# Patient Record
Sex: Female | Born: 1945
Health system: Southern US, Community
[De-identification: ages and names within clinical notes are randomized; demographics above are authoritative.]

## PROBLEM LIST (undated history)

## (undated) DIAGNOSIS — R7303 Prediabetes: Secondary | ICD-10-CM

## (undated) DIAGNOSIS — D649 Anemia, unspecified: Secondary | ICD-10-CM

## (undated) DIAGNOSIS — Z5189 Encounter for other specified aftercare: Secondary | ICD-10-CM

## (undated) DIAGNOSIS — E785 Hyperlipidemia, unspecified: Secondary | ICD-10-CM

## (undated) DIAGNOSIS — H269 Unspecified cataract: Secondary | ICD-10-CM

## (undated) DIAGNOSIS — I10 Essential (primary) hypertension: Secondary | ICD-10-CM

## (undated) HISTORY — DX: Prediabetes: R73.03

## (undated) HISTORY — DX: Hyperlipidemia, unspecified: E78.5

## (undated) HISTORY — PX: ABDOMINAL HYSTERECTOMY: SHX81

## (undated) HISTORY — DX: Encounter for other specified aftercare: Z51.89

## (undated) HISTORY — DX: Unspecified cataract: H26.9

## (undated) HISTORY — DX: Essential (primary) hypertension: I10

---

## 2017-09-30 ENCOUNTER — Ambulatory Visit (INDEPENDENT_AMBULATORY_CARE_PROVIDER_SITE_OTHER): Payer: Medicare HMO | Admitting: Internal Medicine

## 2017-09-30 ENCOUNTER — Encounter: Payer: Self-pay | Admitting: Internal Medicine

## 2017-09-30 ENCOUNTER — Ambulatory Visit: Payer: Self-pay | Admitting: Internal Medicine

## 2017-09-30 VITALS — BP 138/80 | HR 77 | Temp 98.3°F | Resp 18 | Ht 67.0 in | Wt 198.0 lb

## 2017-09-30 DIAGNOSIS — M8580 Other specified disorders of bone density and structure, unspecified site: Secondary | ICD-10-CM

## 2017-09-30 DIAGNOSIS — Z13818 Encounter for screening for other digestive system disorders: Secondary | ICD-10-CM | POA: Diagnosis not present

## 2017-09-30 DIAGNOSIS — Z113 Encounter for screening for infections with a predominantly sexual mode of transmission: Secondary | ICD-10-CM

## 2017-09-30 DIAGNOSIS — I10 Essential (primary) hypertension: Secondary | ICD-10-CM

## 2017-09-30 DIAGNOSIS — Z1159 Encounter for screening for other viral diseases: Secondary | ICD-10-CM

## 2017-09-30 DIAGNOSIS — Z9289 Personal history of other medical treatment: Secondary | ICD-10-CM

## 2017-09-30 DIAGNOSIS — R7303 Prediabetes: Secondary | ICD-10-CM

## 2017-09-30 DIAGNOSIS — E559 Vitamin D deficiency, unspecified: Secondary | ICD-10-CM

## 2017-09-30 DIAGNOSIS — Z1231 Encounter for screening mammogram for malignant neoplasm of breast: Secondary | ICD-10-CM

## 2017-09-30 LAB — COMPREHENSIVE METABOLIC PANEL
ALT: 14 U/L (ref 0–35)
AST: 16 U/L (ref 0–37)
Albumin: 4.4 g/dL (ref 3.5–5.2)
Alkaline Phosphatase: 59 U/L (ref 39–117)
BUN: 16 mg/dL (ref 6–23)
CHLORIDE: 104 meq/L (ref 96–112)
CO2: 32 meq/L (ref 19–32)
Calcium: 9.5 mg/dL (ref 8.4–10.5)
Creatinine, Ser: 0.93 mg/dL (ref 0.40–1.20)
GFR: 76.5 mL/min (ref >60.00)
Glucose, Bld: 102 mg/dL — ABNORMAL HIGH (ref 70–99)
Potassium: 3.8 mEq/L (ref 3.5–5.1)
Sodium: 141 mEq/L (ref 135–145)
Total Bilirubin: 1.5 mg/dL — ABNORMAL HIGH (ref 0.2–1.2)
Total Protein: 7 g/dL (ref 6.0–8.3)

## 2017-09-30 LAB — CBC WITH DIFFERENTIAL/PLATELET
BASOS PCT: 0.7 % (ref 0.0–3.0)
Basophils Absolute: 0 10*3/uL (ref 0.0–0.1)
Eosinophils Absolute: 0.1 10*3/uL (ref 0.0–0.7)
Eosinophils Relative: 2.4 % (ref 0.0–5.0)
HEMATOCRIT: 40.2 % (ref 36.0–46.0)
Hemoglobin: 13.1 g/dL (ref 12.0–15.0)
Lymphocytes Relative: 32.7 % (ref 12.0–46.0)
Lymphs Abs: 1.4 10*3/uL (ref 0.7–4.0)
MCHC: 32.7 g/dL (ref 30.0–36.0)
MCV: 88.4 fl (ref 78.0–100.0)
MONOS PCT: 8.5 % (ref 3.0–12.0)
Monocytes Absolute: 0.4 10*3/uL (ref 0.1–1.0)
NEUTROS ABS: 2.3 10*3/uL (ref 1.4–7.7)
Neutrophils Relative %: 55.7 % (ref 43.0–77.0)
PLATELETS: 232 10*3/uL (ref 150.0–400.0)
RBC: 4.55 Mil/uL (ref 3.87–5.11)
RDW: 14.7 % (ref 11.5–15.5)
WBC: 4.1 10*3/uL (ref 4.0–10.5)

## 2017-09-30 LAB — LIPID PANEL
CHOLESTEROL: 209 mg/dL — AB (ref 0–200)
HDL: 57.7 mg/dL (ref >39.00)
LDL Cholesterol: 134 mg/dL — ABNORMAL HIGH (ref 0–99)
NonHDL: 151.69
Total CHOL/HDL Ratio: 4
Triglycerides: 88 mg/dL (ref 0.0–149.0)
VLDL: 17.6 mg/dL (ref 0.0–40.0)

## 2017-09-30 LAB — TSH: TSH: 1.44 u[IU]/mL (ref 0.35–4.50)

## 2017-09-30 LAB — URINALYSIS, ROUTINE W REFLEX MICROSCOPIC
Bilirubin Urine: NEGATIVE
HGB URINE DIPSTICK: NEGATIVE
KETONES UR: NEGATIVE
LEUKOCYTES UA: NEGATIVE
Nitrite: NEGATIVE
RBC / HPF: NONE SEEN (ref 0–?)
SPECIFIC GRAVITY, URINE: 1.02 (ref 1.000–1.030)
Total Protein, Urine: NEGATIVE
Urine Glucose: NEGATIVE
Urobilinogen, UA: 0.2 (ref 0.0–1.0)
pH: 5.5 (ref 5.0–8.0)

## 2017-09-30 LAB — VITAMIN D 25 HYDROXY (VIT D DEFICIENCY, FRACTURES): VITD: 54.7 ng/mL (ref 30.00–100.00)

## 2017-09-30 NOTE — Patient Instructions (Addendum)
Please follow up in 3 months for physical  Please do cologaurd  [facility]  Take care   Pneumococcal Conjugate Vaccine (PCV13) What You Need to Know 1. Why get vaccinated? Vaccination can protect both children and adults from pneumococcal disease. Pneumococcal disease is caused by bacteria that can spread from person to person through close contact. It can cause ear infections, and it can also lead to more serious infections of the:  Lungs (pneumonia),  Blood (bacteremia), and  Covering of the brain and spinal cord (meningitis).  Pneumococcal pneumonia is most common among adults. Pneumococcal meningitis can cause deafness and brain damage, and it kills about 1 child in 10 who get it. Anyone can get pneumococcal disease, but children under 412 years of age and adults 2865 years and older, people with certain medical conditions, and cigarette smokers are at the highest risk. Before there was a vaccine, the [place] saw:  more than 700 cases of meningitis,  about 13,000 blood infections,  about 5 million ear infections, and  about 200 deaths  in children under 5 each year from pneumococcal disease. Since vaccine became available, severe pneumococcal disease in these children has fallen by 88%. About 18,000 older adults die of pneumococcal disease each year in the [place]. Treatment of pneumococcal infections with penicillin and other drugs is not as effective as it used to be, because some strains of the disease have become resistant to these drugs. This makes prevention of the disease, through vaccination, even more important. 2. PCV13 vaccine Pneumococcal conjugate vaccine (called PCV13) protects against 13 types of pneumococcal bacteria. PCV13 is routinely given to children at 2, 4, 6, and 7912-4415 months of age. It is also recommended for children and adults 672 to 164 years of age with certain health conditions, and for all adults 71 years of age and  older. Your doctor can give you details. 3. Some people should not get this vaccine Anyone who has ever had a life-threatening allergic reaction to a dose of this vaccine, to an earlier pneumococcal vaccine called PCV7, or to any vaccine containing diphtheria toxoid (for example, DTaP), should not get PCV13. Anyone with a severe allergy to any component of PCV13 should not get the vaccine. Tell your doctor if the person being vaccinated has any severe allergies. If the person scheduled for vaccination is not feeling well, your healthcare provider might decide to reschedule the shot on another day. 4. Risks of a vaccine reaction With any medicine, including vaccines, there is a chance of reactions. These are usually mild and go away on their own, but serious reactions are also possible. Problems reported following PCV13 varied by age and dose in the series. The most common problems reported among children were:  About half became drowsy after the shot, had a temporary loss of appetite, or had redness or tenderness where the shot was given.  About 1 out of 3 had swelling where the shot was given.  About 1 out of 3 had a mild fever, and about 1 in 20 had a fever over 102.82F.  Up to about 8 out of 10 became fussy or irritable.  Adults have reported pain, redness, and swelling where the shot was given; also mild fever, fatigue, headache, chills, or muscle pain. Young children who get PCV13 along with inactivated flu vaccine at the same time may be at increased risk for seizures caused by fever. Ask your doctor for more information. Problems that could  happen after any vaccine:  People sometimes faint after a medical procedure, including vaccination. Sitting or lying down for about 15 minutes can help prevent fainting, and injuries caused by a fall. Tell your doctor if you feel dizzy, or have vision changes or ringing in the ears.  Some older children and adults get severe pain in the shoulder and  have difficulty moving the arm where a shot was given. This happens very rarely.  Any medication can cause a severe allergic reaction. Such reactions from a vaccine are very rare, estimated at about 1 in a million doses, and would happen within a few minutes to a few hours after the vaccination. As with any medicine, there is a very small chance of a vaccine causing a serious injury or death. The safety of vaccines is always being monitored. For more information, visit: http://floyd.org/ 5. What if there is a serious reaction? What should I look for? Look for anything that concerns you, such as signs of a severe allergic reaction, very high fever, or unusual behavior. Signs of a severe allergic reaction can include hives, swelling of the face and throat, difficulty breathing, a fast heartbeat, dizziness, and weakness-usually within a few minutes to a few hours after the vaccination. What should I do?  If you think it is a severe allergic reaction or other emergency that can't wait, call 9-1-1 or get the person to the nearest hospital. Otherwise, call your doctor.  Reactions should be reported to the Vaccine Adverse Event Reporting System (VAERS). Your doctor should file this report, or you can do it yourself through the VAERS web site at www.vaers.LAgents.no, or by calling 1-(641)764-8489. ? VAERS does not give medical advice. 6. The National Vaccine Injury Compensation Program The [employer] Vaccine Injury Compensation Program (VICP) is a federal program that was created to compensate people who may have been injured by certain vaccines. Persons who believe they may have been injured by a vaccine can learn about the program and about filing a claim by calling 1-(231)104-7957 or visiting the VICP website at SpiritualWord.at. There is a time limit to file a claim for compensation. 7. How can I learn more?  Ask your healthcare provider. He or she can give you the vaccine package  insert or suggest other sources of information.  Call your local or state health department.  Contact the Centers for Disease Control and Prevention (CDC): ? Call 408-674-6071 (1-800-CDC-INFO) or ? Visit CDC's website at PicCapture.uy Vaccine Information Statement, PCV13 Vaccine (07/18/2014) This information is not intended to replace advice given to you by your health care provider. Make sure you discuss any questions you have with your health care provider. Document Released: 06/27/2006 Document Revised: 05/20/2016 Document Reviewed: 05/20/2016 Elsevier Interactive Patient Education  2017 [employer]  DTaP Vaccine (Diphtheria, Tetanus, and Pertussis): What You Need to Know 1. Why get vaccinated? Diphtheria, tetanus, and pertussis are serious diseases caused by bacteria. Diphtheria and pertussis are spread from person to person. Tetanus enters the body through cuts or wounds. DIPHTHERIA causes a thick covering in the back of the throat.  It can lead to breathing problems, paralysis, heart failure, and even death.  TETANUS (Lockjaw) causes painful tightening of the muscles, usually all over the body.  It can lead to "locking" of the jaw so the victim cannot open his mouth or swallow. Tetanus leads to death in up to 2 out of 10 cases.  PERTUSSIS (Whooping Cough) causes coughing spells so bad that it is hard for infants  to eat, drink, or breathe. These spells can last for weeks.  It can lead to pneumonia, seizures (jerking and staring spells), brain damage, and death.  Diphtheria, tetanus, and pertussis vaccine (DTaP) can help prevent these diseases. Most children who are vaccinated with DTaP will be protected throughout childhood. Many more children would get these diseases if we stopped vaccinating. DTaP is a safer version of an older vaccine called DTP. DTP is no longer used in the [place]. 2. Who should get DTaP vaccine and when? Children should get 5 doses of DTaP  vaccine, one dose at each of the following ages:  2 months  4 months  6 months  15-18 months  4-6 years  DTaP may be given at the same time as other vaccines. 3. Some children should not get DTaP vaccine or should wait  Children with minor illnesses, such as a cold, may be vaccinated. But children who are moderately or severely ill should usually wait until they recover before getting DTaP vaccine.  Any child who had a life-threatening allergic reaction after a dose of DTaP should not get another dose.  Any child who suffered a brain or nervous system disease within 7 days after a dose of DTaP should not get another dose.  Talk with your doctor if your child: ? had a seizure or collapsed after a dose of DTaP, ? cried non-stop for 3 hours or more after a dose of DTaP, ? had a fever over 105F after a dose of DTaP. Ask your doctor for more information. Some of these children should not get another dose of pertussis vaccine, but may get a vaccine without pertussis, called DT. 4. Older children and adults DTaP is not licensed for adolescents, adults, or children 83 years of age and older. But older people still need protection. A vaccine called Tdap is similar to DTaP. A single dose of Tdap is recommended for people 11 through 71 years of age. Another vaccine, called Td, protects against tetanus and diphtheria, but not pertussis. It is recommended every 10 years. There are separate Vaccine Information Statements for these vaccines. 5. What are the risks from DTaP vaccine? Getting diphtheria, tetanus, or pertussis disease is much riskier than getting DTaP vaccine. However, a vaccine, like any medicine, is capable of causing serious problems, such as severe allergic reactions. The risk of DTaP vaccine causing serious harm, or death, is extremely small. Mild problems (common)  Fever (up to about 1 child in 4)  Redness or swelling where the shot was given (up to about 1 child in  4)  Soreness or tenderness where the shot was given (up to about 1 child in 4) These problems occur more often after the 4th and 5th doses of the DTaP series than after earlier doses. Sometimes the 4th or 5th dose of DTaP vaccine is followed by swelling of the entire arm or leg in which the shot was given, lasting 1-7 days (up to about 1 child in 30). Other mild problems include:  Fussiness (up to about 1 child in 3)  Tiredness or poor appetite (up to about 1 child in 10)  Vomiting (up to about 1 child in 50) These problems generally occur 1-3 days after the shot. Moderate problems (uncommon)  Seizure (jerking or staring) (about 1 child out of 14,000)  Non-stop crying, for 3 hours or more (up to about 1 child out of 1,000)  High fever, over 105F (about 1 child out of 16,000) Severe problems (very  rare)  Serious allergic reaction (less than 1 out of a million doses)  Several other severe problems have been reported after DTaP vaccine. These include: ? Long-term seizures, coma, or lowered consciousness ? Permanent brain damage. These are so rare it is hard to tell if they are caused by the vaccine. Controlling fever is especially important for children who have had seizures, for any reason. It is also important if another family member has had seizures. You can reduce fever and pain by giving your child an aspirin-free pain reliever when the shot is given, and for the next 24 hours, following the package instructions. 6. What if there is a serious reaction? What should I look for? Look for anything that concerns you, such as signs of a severe allergic reaction, very high fever, or behavior changes. Signs of a severe allergic reaction can include hives, swelling of the face and throat, difficulty breathing, a fast heartbeat, dizziness, and weakness. These would start a few minutes to a few hours after the vaccination. What should I do?  If you think it is a severe allergic reaction or  other emergency that can't wait, call 9-1-1 or get the person to the nearest hospital. Otherwise, call your doctor.  Afterward, the reaction should be reported to the Vaccine Adverse Event Reporting System (VAERS). Your doctor might file this report, or you can do it yourself through the VAERS web site at www.vaers.LAgents.no, or by calling 1-(303)572-1240. ? VAERS is only for reporting reactions. They do not give medical advice. 7. The National Vaccine Injury Compensation Program The [employer] Vaccine Injury Compensation Program (VICP) is a federal program that was created to compensate people who may have been injured by certain vaccines. Persons who believe they may have been injured by a vaccine can learn about the program and about filing a claim by calling 1-608-088-8738 or visiting the VICP website at SpiritualWord.at. 8. How can I learn more?  Ask your doctor.  Call your local or state health department.  Contact the Centers for Disease Control and Prevention (CDC): ? Call 4254395719 (1-800-CDC-INFO) or ? Visit CDC's website at PicCapture.uy CDC DTaP Vaccine (Diphtheria, Tetanus, and Pertussis) VIS (01/27/06) This information is not intended to replace advice given to you by your health care provider. Make sure you discuss any questions you have with your health care provider. Document Released: 06/27/2006 Document Revised: 05/20/2016 Document Reviewed: 05/20/2016 Elsevier Interactive Patient Education  2017 [employer] Inc.   Hypertension Hypertension, commonly called high blood pressure, is when the force of blood pumping through the arteries is too strong. The arteries are the blood vessels that carry blood from the heart throughout the body. Hypertension forces the heart to work harder to pump blood and may cause arteries to become narrow or stiff. Having untreated or uncontrolled hypertension can cause heart attacks, strokes, kidney disease, and other  problems. A blood pressure reading consists of a higher number over a lower number. Ideally, your blood pressure should be below 120/80. The first ("top") number is called the systolic pressure. It is a measure of the pressure in your arteries as your heart beats. The second ("bottom") number is called the diastolic pressure. It is a measure of the pressure in your arteries as the heart relaxes. What are the causes? The cause of this condition is not known. What increases the risk? Some risk factors for high blood pressure are under your control. Others are not. Factors you can change  Smoking.  Having type 2 diabetes mellitus,  high cholesterol, or both.  Not getting enough exercise or physical activity.  Being overweight.  Having too much fat, sugar, calories, or salt (sodium) in your diet.  Drinking too much alcohol. Factors that are difficult or impossible to change  Having chronic kidney disease.  Having a family history of high blood pressure.  Age. Risk increases with age.  Race. You may be at higher risk if you are African-American.  Gender. Men are at higher risk than women before age 43. After age 55, women are at higher risk than men.  Having obstructive sleep apnea.  Stress. What are the signs or symptoms? Extremely high blood pressure (hypertensive crisis) may cause:  Headache.  Anxiety.  Shortness of breath.  Nosebleed.  Nausea and vomiting.  Severe chest pain.  Jerky movements you cannot control (seizures).  How is this diagnosed? This condition is diagnosed by measuring your blood pressure while you are seated, with your arm resting on a surface. The cuff of the blood pressure monitor will be placed directly against the skin of your upper arm at the level of your heart. It should be measured at least twice using the same arm. Certain conditions can cause a difference in blood pressure between your right and left arms. Certain factors can cause blood  pressure readings to be lower or higher than normal (elevated) for a short period of time:  When your blood pressure is higher when you are in a health care provider's office than when you are at home, this is called white coat hypertension. Most people with this condition do not need medicines.  When your blood pressure is higher at home than when you are in a health care provider's office, this is called masked hypertension. Most people with this condition may need medicines to control blood pressure.  If you have a high blood pressure reading during one visit or you have normal blood pressure with other risk factors:  You may be asked to return on a different day to have your blood pressure checked again.  You may be asked to monitor your blood pressure at home for 1 week or longer.  If you are diagnosed with hypertension, you may have other blood or imaging tests to help your health care provider understand your overall risk for other conditions. How is this treated? This condition is treated by making healthy lifestyle changes, such as eating healthy foods, exercising more, and reducing your alcohol intake. Your health care provider may prescribe medicine if lifestyle changes are not enough to get your blood pressure under control, and if:  Your systolic blood pressure is above 130.  Your diastolic blood pressure is above 80.  Your personal target blood pressure may vary depending on your medical conditions, your age, and other factors. Follow these instructions at home: Eating and drinking  Eat a diet that is high in fiber and potassium, and low in sodium, added sugar, and fat. An example eating plan is called the DASH (Dietary Approaches to Stop Hypertension) diet. To eat this way: ? Eat plenty of fresh fruits and vegetables. Try to fill half of your plate at each meal with fruits and vegetables. ? Eat whole grains, such as whole wheat pasta, brown rice, or whole grain bread. Fill  about one quarter of your plate with whole grains. ? Eat or drink low-fat dairy products, such as skim milk or low-fat yogurt. ? Avoid fatty cuts of meat, processed or cured meats, and poultry with skin. Fill about  one quarter of your plate with lean proteins, such as fish, chicken without skin, beans, eggs, and tofu. ? Avoid premade and processed foods. These tend to be higher in sodium, added sugar, and fat.  Reduce your daily sodium intake. Most people with hypertension should eat less than 1,500 mg of sodium a day.  Limit alcohol intake to no more than 1 drink a day for nonpregnant women and 2 drinks a day for men. One drink equals 12 oz of beer, 5 oz of wine, or 1 oz of hard liquor. Lifestyle  Work with your health care provider to maintain a healthy body weight or to lose weight. Ask what an ideal weight is for you.  Get at least 30 minutes of exercise that causes your heart to beat faster (aerobic exercise) most days of the week. Activities may include walking, swimming, or biking.  Include exercise to strengthen your muscles (resistance exercise), such as pilates or lifting weights, as part of your weekly exercise routine. Try to do these types of exercises for 30 minutes at least 3 days a week.  Do not use any products that contain nicotine or tobacco, such as cigarettes and e-cigarettes. If you need help quitting, ask your health care provider.  Monitor your blood pressure at home as told by your health care provider.  Keep all follow-up visits as told by your health care provider. This is important. Medicines  Take over-the-counter and prescription medicines only as told by your health care provider. Follow directions carefully. Blood pressure medicines must be taken as prescribed.  Do not skip doses of blood pressure medicine. Doing this puts you at risk for problems and can make the medicine less effective.  Ask your health care provider about side effects or reactions to  medicines that you should watch for. Contact a health care provider if:  You think you are having a reaction to a medicine you are taking.  You have headaches that keep coming back (recurring).  You feel dizzy.  You have swelling in your ankles.  You have trouble with your vision. Get help right away if:  You develop a severe headache or confusion.  You have unusual weakness or numbness.  You feel faint.  You have severe pain in your chest or abdomen.  You vomit repeatedly.  You have trouble breathing. Summary  Hypertension is when the force of blood pumping through your arteries is too strong. If this condition is not controlled, it may put you at risk for serious complications.  Your personal target blood pressure may vary depending on your medical conditions, your age, and other factors. For most people, a normal blood pressure is less than 120/80.  Hypertension is treated with lifestyle changes, medicines, or a combination of both. Lifestyle changes include weight loss, eating a healthy, low-sodium diet, exercising more, and limiting alcohol. This information is not intended to replace advice given to you by your health care provider. Make sure you discuss any questions you have with your health care provider. Document Released: 08/30/2005 Document Revised: 07/28/2016 Document Reviewed: 07/28/2016 Elsevier Interactive Patient Education  [employer].

## 2017-10-01 LAB — HEPATITIS C ANTIBODY
Hepatitis C Ab: NONREACTIVE
SIGNAL TO CUT-OFF: 0.01 (ref <1.00)

## 2017-10-01 LAB — HEPATITIS B SURFACE ANTIGEN: Hepatitis B Surface Ag: NONREACTIVE

## 2017-10-01 LAB — HEPATITIS B SURFACE ANTIBODY, QUANTITATIVE: Hepatitis B-Post: <5 m[IU]/mL — ABNORMAL LOW (ref >=10)

## 2017-10-02 ENCOUNTER — Encounter: Payer: Self-pay | Admitting: Internal Medicine

## 2017-10-02 DIAGNOSIS — M8580 Other specified disorders of bone density and structure, unspecified site: Secondary | ICD-10-CM | POA: Insufficient documentation

## 2017-10-02 DIAGNOSIS — M8589 Other specified disorders of bone density and structure, multiple sites: Secondary | ICD-10-CM | POA: Insufficient documentation

## 2017-10-02 DIAGNOSIS — R7303 Prediabetes: Secondary | ICD-10-CM | POA: Insufficient documentation

## 2017-10-02 NOTE — Progress Notes (Signed)
Chief Complaint  Patient presents with  . Establish Care   Establish care moved from [place] back to home of [place] recently  1. HTN BP controlled 138/90 Telmisartan-hctz 80-25 mg qd on BP meds x 5 years.   2. She requests referral for mammogram due and never had colonoscopy agreeable to cologuard    Review of Systems  Constitutional: Negative for weight loss.  HENT: Negative for hearing loss.   Eyes:       +cataracts +wears glasses   Respiratory: Negative for shortness of breath.   Cardiovascular: Negative for chest pain.  Gastrointestinal: Negative for abdominal pain.  Musculoskeletal: Negative for falls.  Skin: Negative for rash.  Neurological: Negative for headaches.  Psychiatric/Behavioral: Negative for memory loss.   Past Medical History:  Diagnosis Date  . Blood transfusion without reported diagnosis   . Cataract    ? which eye wears glasses   . Hypertension   . Prediabetes    Past Surgical History:  Procedure Laterality Date  . ABDOMINAL HYSTERECTOMY     over 30 years fibroids no h/o abnormal pap    Family History  Problem Relation Age of Onset  . Arthritis Mother   . Diabetes Mother   . Hypertension Mother   . Alcohol abuse Father   . Arthritis Father    Social History   Socioeconomic History  . Marital status: Single    Spouse name: Not on file  . Number of children: 0  . Years of education: Not on file  . Highest education level: Not on file  Social Needs  . Financial resource strain: Not on file  . Food insecurity - worry: Not on file  . Food insecurity - inability: Not on file  . Transportation needs - medical: Not on file  . Transportation needs - non-medical: Not on file  Occupational History  . Not on file  Tobacco Use  . Smoking status: Former Games developer  . Smokeless tobacco: Never Used  . Tobacco comment: in 30s smoked x 2 years max 4-5 cig/day  Substance and Sexual Activity  . Alcohol use: No    Frequency: Never  . Drug use: No  .  Sexual activity: No  Other Topics Concern  . 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 Cholecalciferol (VITAMIN D3) 1000 units CAPS Take by mouth.  . cyanocobalamin 1000 MCG tablet Take 1,000 mcg by mouth daily.  . Multiple Vitamins-Minerals (MULTIVITAMIN WOMEN) TABS Take by mouth daily.  Marland Kitchen telmisartan-hydrochlorothiazide (MICARDIS HCT) 80-25 MG tablet Take 1 tablet by mouth daily.  . [DISCONTINUED] telmisartan-hydrochlorothiazide (MICARDIS HCT) 80-12.5 MG tablet Take 1 tablet by mouth daily.   Allergies  Allergen Reactions  . Penicillins     Vaginal infections   Recent Results (from the past 2160 hour(s))  Comprehensive metabolic panel     Status: Abnormal   Collection Time: 09/30/17 10:51 AM  Result Value Ref Range   Sodium 141 135 - 145 mEq/L   Potassium 3.8 3.5 - 5.1 mEq/L   Chloride 104 96 - 112 mEq/L   CO2 32 19 - 32 mEq/L   Glucose, Bld 102 (H) 70 - 99 mg/dL   BUN 16 6 - 23 mg/dL   Creatinine, Ser 1.61 0.40 - 1.20  mg/dL   Total Bilirubin 1.5 (H) 0.2 - 1.2 mg/dL   Alkaline Phosphatase 59 39 - 117 U/L   AST 16 0 - 37 U/L   ALT 14 0 - 35 U/L   Total Protein 7.0 6.0 - 8.3 g/dL   Albumin 4.4 3.5 - 5.2 g/dL   Calcium 9.5 8.4 - 16.1 mg/dL   GFR 09.60 >45.40 mL/min  CBC with Differential/Platelet     Status: None   Collection Time: 09/30/17 10:51 AM  Result Value Ref Range   WBC 4.1 4.0 - 10.5 K/uL   RBC 4.55 3.87 - 5.11 Mil/uL   Hemoglobin 13.1 12.0 - 15.0 g/dL   HCT 98.1 19.1 - 47.8 %   MCV 88.4 78.0 - 100.0 fl   MCHC 32.7 30.0 - 36.0 g/dL   RDW 29.5 62.1 - 30.8 %   Platelets 232.0 150.0 - 400.0 K/uL   Neutrophils Relative % 55.7 43.0 - 77.0 %   Lymphocytes Relative 32.7 12.0 - 46.0 %   Monocytes Relative 8.5 3.0 - 12.0 %   Eosinophils Relative 2.4 0.0 - 5.0 %   Basophils  Relative 0.7 0.0 - 3.0 %   Neutro Abs 2.3 1.4 - 7.7 K/uL   Lymphs Abs 1.4 0.7 - 4.0 K/uL   Monocytes Absolute 0.4 0.1 - 1.0 K/uL   Eosinophils Absolute 0.1 0.0 - 0.7 K/uL   Basophils Absolute 0.0 0.0 - 0.1 K/uL  Urinalysis, Routine w reflex microscopic     Status: Abnormal   Collection Time: 09/30/17 10:51 AM  Result Value Ref Range   Color, Urine YELLOW Yellow;Lt. Yellow   APPearance CLEAR Clear   Specific Gravity, Urine 1.020 1.000 - 1.030   pH 5.5 5.0 - 8.0   Total Protein, Urine NEGATIVE Negative   Urine Glucose NEGATIVE Negative   Ketones, ur NEGATIVE Negative   Bilirubin Urine NEGATIVE Negative   Hgb urine dipstick NEGATIVE Negative   Urobilinogen, UA 0.2 0.0 - 1.0   Leukocytes, UA NEGATIVE Negative   Nitrite NEGATIVE Negative   WBC, UA 0-2/hpf 0-2/hpf   RBC / HPF none seen 0-2/hpf   Squamous Epithelial / LPF Rare(0-4/hpf) Rare(0-4/hpf)   Bacteria, UA Few(10-50/hpf) (A) None  TSH     Status: None   Collection Time: 09/30/17 10:51 AM  Result Value Ref Range   TSH 1.44 0.35 - 4.50 uIU/mL  Lipid panel     Status: Abnormal   Collection Time: 09/30/17 10:51 AM  Result Value Ref Range   Cholesterol 209 (H) 0 - 200 mg/dL    Comment: ATP III Classification       Desirable:  < 200 mg/dL               Borderline High:  200 - 239 mg/dL          High:  > = 657 mg/dL   Triglycerides 84.6 0.0 - 149.0 mg/dL    Comment: Normal:  <962 mg/dLBorderline High:  150 - 199 mg/dL   HDL 95.28 >41.32 mg/dL   VLDL 44.0 0.0 - 10.2 mg/dL   LDL Cholesterol 725 (H) 0 - 99 mg/dL   Total CHOL/HDL Ratio 4     Comment:                Men          Women1/2 Average Risk     3.4          3.3Average Risk          5.0  4.42X Average Risk          9.6          7.13X Average Risk          15.0          11.0                       NonHDL 151.69     Comment: NOTE:  Non-HDL goal should be 30 mg/dL higher than patient's LDL goal (i.e. LDL goal of < 70 mg/dL, would have non-HDL goal of < 100 mg/dL)  Vitamin  D (25 hydroxy)     Status: None   Collection Time: 09/30/17 10:51 AM  Result Value Ref Range   VITD 54.70 30.00 - 100.00 ng/mL  Hepatitis B surface antibody     Status: Abnormal   Collection Time: 09/30/17 10:51 AM  Result Value Ref Range   Hepatitis B-Post <5 (L) > OR = 10 mIU/mL    Comment: . Patient does not have immunity to hepatitis B virus. . For additional information, please refer to http://education.questdiagnostics.com/faq/FAQ105 (This link is being provided for informational/ educational purposes only).   Hepatitis B surface antigen     Status: None   Collection Time: 09/30/17 10:51 AM  Result Value Ref Range   Hepatitis B Surface Ag NON-REACTIVE NON-REACTI  Hepatitis C antibody     Status: None   Collection Time: 09/30/17 10:51 AM  Result Value Ref Range   Hepatitis C Ab NON-REACTIVE NON-REACTI   SIGNAL TO CUT-OFF 0.01 <1.00   Objective  Body mass index is 31.01 kg/m. Wt Readings from Last 3 Encounters:  09/30/17 198 lb (89.8 kg)   Temp Readings from Last 3 Encounters:  09/30/17 98.3 F (36.8 C) (Oral)   BP Readings from Last 3 Encounters:  09/30/17 138/80   Pulse Readings from Last 3 Encounters:  09/30/17 77   O2 sat room air 96%   Physical Exam  Constitutional: She is oriented to person, place, and time and well-developed, well-nourished, and in no distress. Vital signs are normal.  HENT:  Head: Normocephalic and atraumatic.  Eyes: Conjunctivae are normal. Pupils are equal, round, and reactive to light.  Cardiovascular: Normal rate, regular rhythm and normal heart sounds.  Pulmonary/Chest: Effort normal and breath sounds normal. She has no wheezes.  Abdominal: Soft. Bowel sounds are normal. There is no tenderness.  Neurological: She is alert and oriented to person, place, and time. Gait normal.  Skin: Skin is warm and dry.  Psychiatric: Mood, memory, affect and judgment normal.  Nursing note and vitals reviewed.   Assessment   1. HTN controlled   2. Prediabetes A1C 5.9 02/25/17  3. HM Plan   1. Cont meds telmisartan 80/hctz 25 mg qd on x 5 years  Reviewed labs from 02/25/17 Cr was 1.10 with GFR 59 liver kidney normal  rec low sodium diet  Check labs CMET, CBC, hep B/C (h/o blood transfusion), lipid, TSH, UA, vit D 2.  rec healthy diet choices and exercise   3.  Declines flu shot  Disc other vaccines though pt doesn't really like vaccines (Tdap >10 years, shingrix, prevnar and pna 23) and given info about vaccines   Never had colonoscopy agreeable to cologaurd referred  Out of pap window s/p hysterectomy no h/o abnormal pap  Last mammo 09/25/16 [facility] reviewed copy of letter negative due for another 09/26/17 referred  DEXA 12/08/16 osteopenia rec Calcium 600 mg  bid and vit D3 1000 IU qd. rec repeat in 3-5 years.   Former smoker in 30s x 2 years max 4-5 cig per day.  Will need to f/u eye MD in future h/o cataracts small per pt   Pharmacy [facility] on [address] though she is unsure of exact address.  Do Physical in 3 months   Provider: Dr. French Anaracy [facility]Lean-[facility]oc[facility]

## 2017-10-26 ENCOUNTER — Encounter: Payer: Self-pay | Admitting: Internal Medicine

## 2017-10-26 ENCOUNTER — Ambulatory Visit (INDEPENDENT_AMBULATORY_CARE_PROVIDER_SITE_OTHER): Payer: Medicare HMO | Admitting: Internal Medicine

## 2017-10-26 VITALS — BP 130/84 | HR 73 | Temp 98.2°F | Ht 67.0 in | Wt 201.8 lb

## 2017-10-26 DIAGNOSIS — I10 Essential (primary) hypertension: Secondary | ICD-10-CM

## 2017-10-26 DIAGNOSIS — E785 Hyperlipidemia, unspecified: Secondary | ICD-10-CM | POA: Diagnosis not present

## 2017-10-26 DIAGNOSIS — H6991 Unspecified Eustachian tube disorder, right ear: Secondary | ICD-10-CM

## 2017-10-26 MED ORDER — TELMISARTAN-HCTZ 80-25 MG PO TABS: 1.0000 | ORAL_TABLET | Freq: Every day | ORAL | 1 refills | Status: DC

## 2017-10-26 NOTE — Patient Instructions (Signed)
Consider eye exam [facility] in [place], Dr. Leonard SchwartzB [place]   Take care  cologuard on Monday,Tuesday return same day  Consider hep B and Tdap vaccines  Take care  TMS Hepatitis B Vaccine, Recombinant injection What is this medicine? HEPATITIS B VACCINE (hep uh TAHY tis B VAK seen) is a vaccine. It is used to prevent an infection with the hepatitis B virus. This medicine may be used for other purposes; ask your health care provider or pharmacist if you have questions. COMMON BRAND NAME(S): Engerix-B, Recombivax HB What should I tell my health care provider before I take this medicine? They need to know if you have any of these conditions: -fever, infection -heart disease -hepatitis B infection -immune system problems -kidney disease -an unusual or allergic reaction to vaccines, yeast, other medicines, foods, dyes, or preservatives -pregnant or trying to get pregnant -breast-feeding How should I use this medicine? This vaccine is for injection into a muscle. It is given by a health care professional. A copy of Vaccine Information Statements will be given before each vaccination. Read this sheet carefully each time. The sheet may change frequently. Talk to your pediatrician regarding the use of this medicine in children. While this drug may be prescribed for children as young as newborn for selected conditions, precautions do apply. Overdosage: If you think you have taken too much of this medicine contact a poison control center or emergency room at once. NOTE: This medicine is only for you. Do not share this medicine with others. What if I miss a dose? It is important not to miss your dose. Call your doctor or health care professional if you are unable to keep an appointment. What may interact with this medicine? -medicines that suppress your immune function like adalimumab, anakinra, infliximab -medicines to treat cancer -steroid medicines like prednisone or cortisone This list  may not describe all possible interactions. Give your health care provider a list of all the medicines, herbs, non-prescription drugs, or dietary supplements you use. Also tell them if you smoke, drink alcohol, or use illegal drugs. Some items may interact with your medicine. What should I watch for while using this medicine? See your health care provider for all shots of this vaccine as directed. You must have 3 shots of this vaccine for protection from hepatitis B infection. Tell your doctor right away if you have any serious or unusual side effects after getting this vaccine. What side effects may I notice from receiving this medicine? Side effects that you should report to your doctor or health care professional as soon as possible: -allergic reactions like skin rash, itching or hives, swelling of the face, lips, or tongue -breathing problems -confused, irritated -fast, irregular heartbeat -flu-like syndrome -numb, tingling pain -seizures -unusually weak or tired Side effects that usually do not require medical attention (report to your doctor or health care professional if they continue or are bothersome): -diarrhea -fever -headache -loss of appetite -muscle pain -nausea -pain, redness, swelling, or irritation at site where injected -tiredness This list may not describe all possible side effects. Call your doctor for medical advice about side effects. You may report side effects to FDA at 1-800-FDA-1088. Where should I keep my medicine? This drug is given in a hospital or clinic and will not be stored at home. NOTE: This sheet is a summary. It may not cover all possible information. If you have questions about this medicine, talk to your doctor, pharmacist, or health care provider.  2018 Elsevier/Gold  Standard (2013-12-31 13:26:01)  DTaP Vaccine (Diphtheria, Tetanus, and Pertussis): What You Need to Know 1. Why get vaccinated? Diphtheria, tetanus, and pertussis are serious  diseases caused by bacteria. Diphtheria and pertussis are spread from person to person. Tetanus enters the body through cuts or wounds. DIPHTHERIA causes a thick covering in the back of the throat.  It can lead to breathing problems, paralysis, heart failure, and even death.  TETANUS (Lockjaw) causes painful tightening of the muscles, usually all over the body.  It can lead to "locking" of the jaw so the victim cannot open his mouth or swallow. Tetanus leads to death in up to 2 out of 10 cases.  PERTUSSIS (Whooping Cough) causes coughing spells so bad that it is hard for infants to eat, drink, or breathe. These spells can last for weeks.  It can lead to pneumonia, seizures (jerking and staring spells), brain damage, and death.  Diphtheria, tetanus, and pertussis vaccine (DTaP) can help prevent these diseases. Most children who are vaccinated with DTaP will be protected throughout childhood. Many more children would get these diseases if we stopped vaccinating. DTaP is a safer version of an older vaccine called DTP. DTP is no longer used in the [place]. 2. Who should get DTaP vaccine and when? Children should get 5 doses of DTaP vaccine, one dose at each of the following ages:  2 months  4 months  6 months  15-18 months  4-6 years  DTaP may be given at the same time as other vaccines. 3. Some children should not get DTaP vaccine or should wait  Children with minor illnesses, such as a cold, may be vaccinated. But children who are moderately or severely ill should usually wait until they recover before getting DTaP vaccine.  Any child who had a life-threatening allergic reaction after a dose of DTaP should not get another dose.  Any child who suffered a brain or nervous system disease within 7 days after a dose of DTaP should not get another dose.  Talk with your doctor if your child: ? had a seizure or collapsed after a dose of DTaP, ? cried non-stop for 3 hours or more  after a dose of DTaP, ? had a fever over 105F after a dose of DTaP. Ask your doctor for more information. Some of these children should not get another dose of pertussis vaccine, but may get a vaccine without pertussis, called DT. 4. Older children and adults DTaP is not licensed for adolescents, adults, or children 60 years of age and older. But older people still need protection. A vaccine called Tdap is similar to DTaP. A single dose of Tdap is recommended for people 11 through 71 years of age. Another vaccine, called Td, protects against tetanus and diphtheria, but not pertussis. It is recommended every 10 years. There are separate Vaccine Information Statements for these vaccines. 5. What are the risks from DTaP vaccine? Getting diphtheria, tetanus, or pertussis disease is much riskier than getting DTaP vaccine. However, a vaccine, like any medicine, is capable of causing serious problems, such as severe allergic reactions. The risk of DTaP vaccine causing serious harm, or death, is extremely small. Mild problems (common)  Fever (up to about 1 child in 4)  Redness or swelling where the shot was given (up to about 1 child in 4)  Soreness or tenderness where the shot was given (up to about 1 child in 4) These problems occur more often after the 4th and 5th doses of  the DTaP series than after earlier doses. Sometimes the 4th or 5th dose of DTaP vaccine is followed by swelling of the entire arm or leg in which the shot was given, lasting 1-7 days (up to about 1 child in 30). Other mild problems include:  Fussiness (up to about 1 child in 3)  Tiredness or poor appetite (up to about 1 child in 10)  Vomiting (up to about 1 child in 50) These problems generally occur 1-3 days after the shot. Moderate problems (uncommon)  Seizure (jerking or staring) (about 1 child out of 14,000)  Non-stop crying, for 3 hours or more (up to about 1 child out of 1,000)  High fever, over 105F (about 1 child  out of 16,000) Severe problems (very rare)  Serious allergic reaction (less than 1 out of a million doses)  Several other severe problems have been reported after DTaP vaccine. These include: ? Long-term seizures, coma, or lowered consciousness ? Permanent brain damage. These are so rare it is hard to tell if they are caused by the vaccine. Controlling fever is especially important for children who have had seizures, for any reason. It is also important if another family member has had seizures. You can reduce fever and pain by giving your child an aspirin-free pain reliever when the shot is given, and for the next 24 hours, following the package instructions. 6. What if there is a serious reaction? What should I look for? Look for anything that concerns you, such as signs of a severe allergic reaction, very high fever, or behavior changes. Signs of a severe allergic reaction can include hives, swelling of the face and throat, difficulty breathing, a fast heartbeat, dizziness, and weakness. These would start a few minutes to a few hours after the vaccination. What should I do?  If you think it is a severe allergic reaction or other emergency that can't wait, call 9-1-1 or get the person to the nearest hospital. Otherwise, call your doctor.  Afterward, the reaction should be reported to the Vaccine Adverse Event Reporting System (VAERS). Your doctor might file this report, or you can do it yourself through the VAERS web site at www.vaers.LAgents.no, or by calling 1-(586)491-6493. ? VAERS is only for reporting reactions. They do not give medical advice. 7. The National Vaccine Injury Compensation Program The [employer] Vaccine Injury Compensation Program (VICP) is a federal program that was created to compensate people who may have been injured by certain vaccines. Persons who believe they may have been injured by a vaccine can learn about the program and about filing a claim by calling 1-402-043-4464  or visiting the VICP website at SpiritualWord.at. 8. How can I learn more?  Ask your doctor.  Call your local or state health department.  Contact the Centers for Disease Control and Prevention (CDC): ? Call 865-709-6584 (1-800-CDC-INFO) or ? Visit CDC's website at PicCapture.uy CDC DTaP Vaccine (Diphtheria, Tetanus, and Pertussis) VIS (01/27/06) This information is not intended to replace advice given to you by your health care provider. Make sure you discuss any questions you have with your health care provider. Document Released: 06/27/2006 Document Revised: 05/20/2016 Document Reviewed: 05/20/2016 Elsevier Interactive Patient Education  2017 [employer].

## 2017-10-26 NOTE — Progress Notes (Signed)
Pre visit review using our clinic review tool, if applicable. No additional management support is needed unless otherwise documented below in the visit note. 

## 2017-10-26 NOTE — Progress Notes (Addendum)
Chief Complaint  Patient presents with  . Follow-up    labs   F/u 1. Disc labs +HLD, elevated TB, rec hep B vaccine, vit D nl  2. C/o right ear discomfort with h/o allergies nothing tried 3. HTN on Micardis 80-25 doing well controlled needs refill    Review of Systems  Constitutional: Negative for weight loss.  HENT: Positive for ear pain.   Eyes:       No vision problems   Respiratory: Negative for shortness of breath.   Cardiovascular: Negative for chest pain and leg swelling.  Gastrointestinal: Negative for abdominal pain.  Musculoskeletal: Negative for falls.  Skin: Negative for rash.  Neurological: Negative for headaches.  Psychiatric/Behavioral: Negative for memory loss.   Past Medical History:  Diagnosis Date  . Blood transfusion without reported diagnosis   . Cataract    ? which eye wears glasses   . Hypertension   . Prediabetes    Past Surgical History:  Procedure Laterality Date  . ABDOMINAL HYSTERECTOMY     over 30 years fibroids no h/o abnormal pap    Family History  Problem Relation Age of Onset  . Arthritis Mother   . Diabetes Mother   . Hypertension Mother   . Alcohol abuse Father   . Arthritis Father    Social History   Socioeconomic History  . Marital status: Single    Spouse name: Not on file  . Number of children: 0  . Years of education: Not on file  . Highest education level: Not on file  Social Needs  . Financial resource strain: Not on file  . Food insecurity - worry: Not on file  . Food insecurity - inability: Not on file  . Transportation needs - medical: Not on file  . Transportation needs - non-medical: Not on file  Occupational History  . Not on file  Tobacco Use  . Smoking status: Former Games developer  . Smokeless tobacco: Never Used  . Tobacco comment: in 30s smoked x 2 years max 4-5 cig/day  Substance and Sexual Activity  . Alcohol use: No    Frequency: Never  . Drug use: No  . Sexual activity: No  Other Topics Concern  .  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 Cholecalciferol (VITAMIN D3) 1000 units CAPS Take by mouth.  . cyanocobalamin 1000 MCG tablet Take 1,000 mcg by mouth daily.  . Multiple Vitamins-Minerals (MULTIVITAMIN WOMEN) TABS Take by mouth daily.  Marland Kitchen telmisartan-hydrochlorothiazide (MICARDIS HCT) 80-25 MG tablet Take 1 tablet by mouth daily. In am  . [DISCONTINUED] telmisartan-hydrochlorothiazide (MICARDIS HCT) 80-25 MG tablet Take 1 tablet by mouth daily.   Allergies  Allergen Reactions  . Penicillins     Vaginal infections   Recent Results (from the past 2160 hour(s))  Comprehensive metabolic panel     Status: Abnormal   Collection Time: 09/30/17 10:51 AM  Result Value Ref Range   Sodium 141 135 - 145 mEq/L   Potassium 3.8 3.5 - 5.1 mEq/L   Chloride 104 96 - 112 mEq/L   CO2 32 19 - 32 mEq/L   Glucose, Bld 102 (H) 70 - 99 mg/dL   BUN 16 6 - 23 mg/dL   Creatinine, Ser 1.61 0.40 - 1.20  mg/dL   Total Bilirubin 1.5 (H) 0.2 - 1.2 mg/dL   Alkaline Phosphatase 59 39 - 117 U/L   AST 16 0 - 37 U/L   ALT 14 0 - 35 U/L   Total Protein 7.0 6.0 - 8.3 g/dL   Albumin 4.4 3.5 - 5.2 g/dL   Calcium 9.5 8.4 - 16.1 mg/dL   GFR 09.60 >45.40 mL/min  CBC with Differential/Platelet     Status: None   Collection Time: 09/30/17 10:51 AM  Result Value Ref Range   WBC 4.1 4.0 - 10.5 K/uL   RBC 4.55 3.87 - 5.11 Mil/uL   Hemoglobin 13.1 12.0 - 15.0 g/dL   HCT 98.1 19.1 - 47.8 %   MCV 88.4 78.0 - 100.0 fl   MCHC 32.7 30.0 - 36.0 g/dL   RDW 29.5 62.1 - 30.8 %   Platelets 232.0 150.0 - 400.0 K/uL   Neutrophils Relative % 55.7 43.0 - 77.0 %   Lymphocytes Relative 32.7 12.0 - 46.0 %   Monocytes Relative 8.5 3.0 - 12.0 %   Eosinophils Relative 2.4 0.0 - 5.0 %   Basophils Relative 0.7 0.0 - 3.0 %   Neutro Abs 2.3  1.4 - 7.7 K/uL   Lymphs Abs 1.4 0.7 - 4.0 K/uL   Monocytes Absolute 0.4 0.1 - 1.0 K/uL   Eosinophils Absolute 0.1 0.0 - 0.7 K/uL   Basophils Absolute 0.0 0.0 - 0.1 K/uL  Urinalysis, Routine w reflex microscopic     Status: Abnormal   Collection Time: 09/30/17 10:51 AM  Result Value Ref Range   Color, Urine YELLOW Yellow;Lt. Yellow   APPearance CLEAR Clear   Specific Gravity, Urine 1.020 1.000 - 1.030   pH 5.5 5.0 - 8.0   Total Protein, Urine NEGATIVE Negative   Urine Glucose NEGATIVE Negative   Ketones, ur NEGATIVE Negative   Bilirubin Urine NEGATIVE Negative   Hgb urine dipstick NEGATIVE Negative   Urobilinogen, UA 0.2 0.0 - 1.0   Leukocytes, UA NEGATIVE Negative   Nitrite NEGATIVE Negative   WBC, UA 0-2/hpf 0-2/hpf   RBC / HPF none seen 0-2/hpf   Squamous Epithelial / LPF Rare(0-4/hpf) Rare(0-4/hpf)   Bacteria, UA Few(10-50/hpf) (A) None  TSH     Status: None   Collection Time: 09/30/17 10:51 AM  Result Value Ref Range   TSH 1.44 0.35 - 4.50 uIU/mL  Lipid panel     Status: Abnormal   Collection Time: 09/30/17 10:51 AM  Result Value Ref Range   Cholesterol 209 (H) 0 - 200 mg/dL    Comment: ATP III Classification       Desirable:  < 200 mg/dL               Borderline High:  200 - 239 mg/dL          High:  > = 657 mg/dL   Triglycerides 84.6 0.0 - 149.0 mg/dL    Comment: Normal:  <962 mg/dLBorderline High:  150 - 199 mg/dL   HDL 95.28 >41.32 mg/dL   VLDL 44.0 0.0 - 10.2 mg/dL   LDL Cholesterol 725 (H) 0 - 99 mg/dL   Total CHOL/HDL Ratio 4     Comment:                Men          Women1/2 Average Risk     3.4          3.3Average Risk          5.0  4.42X Average Risk          9.6          7.13X Average Risk          15.0          11.0                       NonHDL 151.69     Comment: NOTE:  Non-HDL goal should be 30 mg/dL higher than patient's LDL goal (i.e. LDL goal of < 70 mg/dL, would have non-HDL goal of < 100 mg/dL)  Vitamin D (25 hydroxy)     Status: None    Collection Time: 09/30/17 10:51 AM  Result Value Ref Range   VITD 54.70 30.00 - 100.00 ng/mL  Hepatitis B surface antibody     Status: Abnormal   Collection Time: 09/30/17 10:51 AM  Result Value Ref Range   Hepatitis B-Post <5 (L) > OR = 10 mIU/mL    Comment: . Patient does not have immunity to hepatitis B virus. . For additional information, please refer to http://education.quest[facility].com/faq/FAQ105 (This link is being provided for informational/ educational purposes only).   Hepatitis B surface antigen     Status: None   Collection Time: 09/30/17 10:51 AM  Result Value Ref Range   Hepatitis B Surface Ag NON-REACTIVE NON-REACTI  Hepatitis C antibody     Status: None   Collection Time: 09/30/17 10:51 AM  Result Value Ref Range   Hepatitis C Ab NON-REACTIVE NON-REACTI   SIGNAL TO CUT-OFF 0.01 <1.00   Objective  Body mass index is 31.61 kg/m. Wt Readings from Last 3 Encounters:  10/26/17 201 lb 12.8 oz (91.5 kg)  09/30/17 198 lb (89.8 kg)   Temp Readings from Last 3 Encounters:  10/26/17 98.2 F (36.8 C) (Oral)  09/30/17 98.3 F (36.8 C) (Oral)   BP Readings from Last 3 Encounters:  10/26/17 130/84  09/30/17 138/80   Pulse Readings from Last 3 Encounters:  10/26/17 73  09/30/17 77   O2 sat room air 95%  Physical Exam  Constitutional: She is oriented to person, place, and time and well-developed, well-nourished, and in no distress.  HENT:  Head: Normocephalic and atraumatic.  Eyes: Conjunctivae are normal. Pupils are equal, round, and reactive to light.  Cardiovascular: Normal rate, regular rhythm and normal heart sounds.  Pulmonary/Chest: Effort normal and breath sounds normal.  Neurological: She is alert and oriented to person, place, and time. Gait normal.  Skin: Skin is warm and dry.  Psychiatric: Mood, memory, affect and judgment normal.  Nursing note and vitals reviewed.   Assessment   1.eustachian tube dys, right. Mild wax rigth ear  2. HTN  3.  HLD  4.HM Plan  1. Zyrtec or clairitin  2. Refilled med cont same dose  3. Disc statin and recheck lipid at f/u also will recheck lfts elevated tb 4.  Physical at f/u  Declines flu shot  Disc other vaccines though pt doesn't really like vaccines (Tdap >10 years, shingrix, prevnar and pna 23) given info about vaccines today  Tdap, hep b vaccine  Has cologaurd not done yet notified 12/13/17 pt has not done yet Out of pap window s/p hysterectomy no h/o abnormal pap  [facility] copy of letter negative due for another 09/26/17 referred  -given info to call and sch [place]   DEXA 12/08/16 osteopenia rec Calcium 600 mg bid and vit D3 1000  IU qd. rec repeat in 3-5 years.   Former smoker in 30s x 2 years max 4-5 cig per day.  Will need to f/u eye MD in future h/o cataracts small per pt disc Dr. Fannie Knee, [facility]     Provider: Dr. French Ana [facility]

## 2017-11-14 ENCOUNTER — Ambulatory Visit: Payer: Medicare HMO | Admitting: Internal Medicine

## 2017-11-30 ENCOUNTER — Encounter: Payer: Self-pay | Admitting: Radiology

## 2017-11-30 ENCOUNTER — Ambulatory Visit
Admission: RE | Admit: 2017-11-30 | Discharge: 2017-11-30 | Disposition: A | Discharge status: Home / Self Care | Payer: Medicare HMO | Source: Ambulatory Visit | Attending: Internal Medicine | Admitting: Internal Medicine

## 2017-11-30 DIAGNOSIS — Z1231 Encounter for screening mammogram for malignant neoplasm of breast: Secondary | ICD-10-CM

## 2017-12-21 ENCOUNTER — Other Ambulatory Visit: Payer: Self-pay | Admitting: *Deleted

## 2017-12-21 ENCOUNTER — Inpatient Hospital Stay
Admission: RE | Admit: 2017-12-21 | Discharge: 2017-12-21 | Disposition: A | Discharge status: Home / Self Care | Payer: Self-pay | Source: Ambulatory Visit | Attending: *Deleted | Admitting: *Deleted

## 2017-12-21 DIAGNOSIS — Z9289 Personal history of other medical treatment: Secondary | ICD-10-CM

## 2017-12-28 ENCOUNTER — Other Ambulatory Visit: Payer: Self-pay | Admitting: Internal Medicine

## 2017-12-28 DIAGNOSIS — N3000 Acute cystitis without hematuria: Secondary | ICD-10-CM

## 2017-12-28 MED ORDER — CIPROFLOXACIN HCL 500 MG PO TABS: 500.0000 mg | ORAL_TABLET | Freq: Two times a day (BID) | ORAL | 0 refills | Status: DC

## 2017-12-28 NOTE — Addendum Note (Signed)
Addended by: Quentin OreMCLEAN-SCOCUZZA, Charmin Aguiniga on: 12/28/2017 09:21 AM   Modules accepted: Orders

## 2017-12-29 ENCOUNTER — Encounter: Payer: Self-pay | Admitting: Internal Medicine

## 2017-12-29 ENCOUNTER — Ambulatory Visit (INDEPENDENT_AMBULATORY_CARE_PROVIDER_SITE_OTHER): Payer: Medicare HMO | Admitting: Internal Medicine

## 2017-12-29 ENCOUNTER — Ambulatory Visit (INDEPENDENT_AMBULATORY_CARE_PROVIDER_SITE_OTHER): Payer: Medicare HMO

## 2017-12-29 ENCOUNTER — Ambulatory Visit: Payer: Medicare HMO | Admitting: Internal Medicine

## 2017-12-29 VITALS — BP 128/70 | HR 75 | Temp 98.4°F | Ht 67.0 in | Wt 203.8 lb

## 2017-12-29 DIAGNOSIS — H269 Unspecified cataract: Secondary | ICD-10-CM | POA: Insufficient documentation

## 2017-12-29 DIAGNOSIS — R05 Cough: Secondary | ICD-10-CM

## 2017-12-29 DIAGNOSIS — R059 Cough, unspecified: Secondary | ICD-10-CM

## 2017-12-29 DIAGNOSIS — E785 Hyperlipidemia, unspecified: Secondary | ICD-10-CM

## 2017-12-29 DIAGNOSIS — E049 Nontoxic goiter, unspecified: Secondary | ICD-10-CM

## 2017-12-29 DIAGNOSIS — R7303 Prediabetes: Secondary | ICD-10-CM | POA: Diagnosis not present

## 2017-12-29 DIAGNOSIS — J309 Allergic rhinitis, unspecified: Secondary | ICD-10-CM | POA: Insufficient documentation

## 2017-12-29 DIAGNOSIS — I10 Essential (primary) hypertension: Secondary | ICD-10-CM | POA: Diagnosis not present

## 2017-12-29 DIAGNOSIS — J984 Other disorders of lung: Secondary | ICD-10-CM | POA: Diagnosis not present

## 2017-12-29 DIAGNOSIS — Z0001 Encounter for general adult medical examination with abnormal findings: Secondary | ICD-10-CM | POA: Diagnosis not present

## 2017-12-29 HISTORY — DX: Nontoxic goiter, unspecified: E04.9

## 2017-12-29 HISTORY — DX: Allergic rhinitis, unspecified: J30.9

## 2017-12-29 NOTE — Patient Instructions (Addendum)
[facility] [phone] we referred you today  [address]  [place] [address]   Please schedule fasting labs no food only water and medications x 12 hours  We referred for thyroid ultrasound  Please do cologaurd    Goiter A goiter is an enlarged thyroid gland. The thyroid gland is located in the lower front of the neck. The gland produces hormones that regulate mood, body temperature, pulse rate, and digestion. Most goiters are painless and are not a cause for serious concern. Goiters and conditions that cause goiters can be treated, if necessary. What are the causes? Causes of this condition include:  Diseases that attack healthy cells in your body (autoimmune diseases) and affect your thyroid function, such as: ? Graves disease. This causes too much thyroid hormone to be produced and it makes your thyroid overly active (hyperthyroidism). ? Hashimoto disease. This type of inflammation of the thyroid (thyroiditis) causes too little thyroid hormone to be produced and it makes your thyroid not active enough (hypothyroidism).  Other conditions that cause thyroiditis.  Nodular goiter. This means that there are one or more small growths on your thyroid. These can create too much thyroid hormone.  Pregnancy.  Thyroid cancer. This is rare.  Certain medicines.  Radiation exposure.  Iodine deficiency.  In some cases, the cause may not be known (idiopathic). What increases the risk? This condition is more likely to develop in:  People who have a family history of goiter.  Women.  People who do not get enough iodine in their diet.  People who are older than 40.  People who smoke tobacco.  What are the signs or symptoms? Common symptoms of this condition include:  Swelling in the lower part of the neck. This swelling can range from a very small bump to a large lump.  A tight feeling in the throat.  A hoarse voice.  Other symptoms  include:  Coughing.  Wheezing.  Difficulty swallowing.  Difficulty breathing.  Bulging neck veins.  Dizziness.  In some cases, there are no symptoms and thyroid hormone levels may be normal. When a goiter is the result of hyperthyroidism, symptoms may also include:  Nervousness or restlessness.  Inability to tolerate heat.  Unexplained weight loss.  Diarrhea.  Change in the texture of hair or skin.  Changes in heart beat, such as skipped beats, extra beats, or a rapid heart rate.  Loss of menstruation.  Shaky hands.  Increased appetite.  Sleep problems.  When a goiter is the result of hypothyroidism, symptoms may also include:  Feeling like you have no energy (lethargy).  Inability to tolerate cold.  Weight gain that is not explained by a change in diet or exercise habits.  Dry skin.  Coarse hair.  Menstrual irregularity.  Constipation.  Sadness or depression.  How is this diagnosed? This condition may be diagnosed with a medical history and physical exam. You may also have other tests, including:  Blood tests to check thyroid function.  Imaging tests, such as: ? Ultrasonography. ? CT scan. ? MRI. ? Thyroid scan. You will be given a safe radioactive injection, then images will be taken of your thyroid.  Tissue sample (biopsy) of the goiter or any nodules. This checks to see if the goiter or nodules are cancerous.  How is this treated? Treatment for this condition depends on the cause. Treatment may include:  Medicines to control your thyroid.  Anti-inflammatory or steroid medicines, if inflammation is the cause.  Iodine supplements or  changes in diet, if the goiter is caused by iodine deficiency.  Radiation therapy.  Surgery to remove your thyroid.  In some cases, no treatment is necessary, and your health care provider will monitor your condition at regular checkups. Follow these instructions at home:  Follow recommendations from  your health care provider for any changes to your diet.  Take over-the-counter and prescription medicines only as told by your health care provider.  Do not use any tobacco products, including cigarettes, chewing tobacco, or e-cigarettes. If you need help quitting, ask your health care provider.  Keep all follow-up appointments as told by your health care provider. This is important. Contact a health care provider if:  Your symptoms do not get better with treatment. Get help right away if:  You develop sudden, unexplained confusion or other mental changes.  You have nausea, vomiting, or diarrhea.  You develop a fever.  Your skin or the whites of your eyes appear yellow (jaundice).  You develop chest pain.  You have trouble breathing or swallowing.  You suddenly become very weak.  You experience extreme restlessness. This information is not intended to replace advice given to you by your health care provider. Make sure you discuss any questions you have with your health care provider. Document Released: 02/17/2010 Document Revised: 03/19/2016 Document Reviewed: 08/26/2014 Elsevier Interactive Patient Education  [employer]2018 Elsevier Inc.

## 2017-12-29 NOTE — Progress Notes (Signed)
Chief Complaint  Patient presents with  . Annual Exam   Annual exam  1. HTN controlled on micardis 80-25 mg qd  2. C/o allergies not taking OTC meds cant tolerate NS or flonase due to nose bleeds  3. H/o cataracts wants to see eye MD    Review of Systems  Constitutional: Negative for weight loss.  HENT: Negative for hearing loss.        +runny nose   Eyes: Negative for blurred vision.  Respiratory: Negative for shortness of breath.   Cardiovascular: Negative for chest pain.  Gastrointestinal: Negative for abdominal pain.  Musculoskeletal: Negative for falls.  Skin: Negative for rash.  Neurological: Negative for headaches.  Endo/Heme/Allergies: Positive for environmental allergies.  Psychiatric/Behavioral: Negative for depression and memory loss.   Past Medical History:  Diagnosis Date  . Blood transfusion without reported diagnosis   . Cataract    ? which eye wears glasses   . Hypertension   . Prediabetes    Past Surgical History:  Procedure Laterality Date  . ABDOMINAL HYSTERECTOMY     over 30 years fibroids no h/o abnormal pap    Family History  Problem Relation Age of Onset  . Arthritis Mother   . Diabetes Mother   . Hypertension Mother   . Alcohol abuse Father   . Arthritis Father    Social History   Socioeconomic History  . Marital status: Single    Spouse name: Not on file  . Number of children: 0  . Years of education: Not on file  . Highest education level: Not on file  Occupational History  . Not on file  Social Needs  . Financial resource strain: Not on file  . Food insecurity:    Worry: Not on file    Inability: Not on file  . Transportation needs:    Medical: Not on file    Non-medical: Not on file  Tobacco Use  . Smoking status: Former Games developermoker  . Smokeless tobacco: Never Used  . Tobacco comment: in 30s smoked x 2 years max 4-5 cig/day  Substance and Sexual Activity  . Alcohol use: No    Frequency: Never  . Drug use: No  . Sexual  activity: Never  Lifestyle  . Physical activity:    Days per week: Not on file    Minutes per session: Not on file  . Stress: Not on file  Relationships  . Social connections:    Talks on phone: Not on file    Gets together: Not on file    Attends religious service: Not on file    Active member of club or organization: Not on file    Attends meetings of clubs or organizations: Not on file    Relationship status: Not on file  . Intimate partner violence:    Fear of current or ex partner: Not on file    Emotionally abused: Not on file    Physically abused: Not on file    Forced sexual activity: Not on file  Other Topics Concern  . 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 Cholecalciferol (VITAMIN D3) 1000 units CAPS Take by mouth.  . cyanocobalamin 1000 MCG tablet Take 1,000 mcg by mouth daily.  . Multiple  Vitamins-Minerals (MULTIVITAMIN WOMEN) TABS Take by mouth daily.  Marland Kitchen telmisartan-hydrochlorothiazide (MICARDIS HCT) 80-25 MG tablet Take 1 tablet by mouth daily. In am   Allergies  Allergen Reactions  . Penicillins     Vaginal infections   No results found for this or any previous visit (from the past 2160 hour(s)). Objective  Body mass index is 31.92 kg/m. Wt Readings from Last 3 Encounters:  12/29/17 203 lb 12.8 oz (92.4 kg)  10/26/17 201 lb 12.8 oz (91.5 kg)  09/30/17 198 lb (89.8 kg)   Temp Readings from Last 3 Encounters:  12/29/17 98.4 F (36.9 C) (Oral)  10/26/17 98.2 F (36.8 C) (Oral)  09/30/17 98.3 F (36.8 C) (Oral)   BP Readings from Last 3 Encounters:  12/29/17 128/70  10/26/17 130/84  09/30/17 138/80   Pulse Readings from Last 3 Encounters:  12/29/17 75  10/26/17 73  09/30/17 77    Physical Exam  Constitutional: She is oriented to person, place,  and time. Vital signs are normal. She appears well-developed and well-nourished.  HENT:  Head: Normocephalic and atraumatic.  Mouth/Throat: Oropharynx is clear and moist and mucous membranes are normal.  Fluid in b/l ears   Eyes: Pupils are equal, round, and reactive to light. Conjunctivae are normal.  Neck: Thyromegaly present.    Cardiovascular: Normal rate, regular rhythm and normal heart sounds.  Pulmonary/Chest: Effort normal and breath sounds normal.  Abdominal: Soft. Bowel sounds are normal.  Neurological: She is alert and oriented to person, place, and time. Gait normal.  Skin: Skin is warm, dry and intact.  Psychiatric: She has a normal mood and affect. Her speech is normal and behavior is normal. Judgment and thought content normal. Cognition and memory are normal.  Nursing note and vitals reviewed.   Assessment   1. Annual exam with abnormal finding  2. Allergic rhinitis  3. Thyroid enlargement on the right  4. Cataracts  5. HTN 6. HM Plan  1.  EKG today  rec exercise and healthy diet choices  Referred eye MD [facility]  And thyroid US  2. OTC allergy pills Unable to tolerate NS, flonase  Disc singulair pt declines  3.  Thyroid US  4. Referred to [facility]  5. Cont micardis 80-25 mg qd EKG today low voltage, artifact, no ST/T wave changes low voltage and c/w pulm disease will do CXR today    6.  Declines flu shot  Disc other vaccines though pt doesn't really like vaccines (Tdap >10 years, shingrix, prevnar and pna 23) Prev given info about vaccines today Tdap, hep b vaccine -pt declined Hep B vaccine   Has cologaurd not done yet Out of pap window s/p hysterectomy no h/o abnormal pap  mammo 11/30/17 negative  DEXA 12/08/16 osteopenia rec Calcium 600 mg bid and vit D3 1000 IU qd. rec repeat in 3-5 years.  Former smoker in 30s x 2 years max 4-5 cig per day.  H/o cataracts referred to [facility] Dr. Dorcas Mcmurray      Provider: Dr. French Ana  [facility]

## 2017-12-29 NOTE — Progress Notes (Signed)
Pre visit review using our clinic review tool, if applicable. No additional management support is needed unless otherwise documented below in the visit note. 

## 2018-01-03 ENCOUNTER — Other Ambulatory Visit (INDEPENDENT_AMBULATORY_CARE_PROVIDER_SITE_OTHER): Payer: Medicare HMO

## 2018-01-03 DIAGNOSIS — I10 Essential (primary) hypertension: Secondary | ICD-10-CM | POA: Diagnosis not present

## 2018-01-03 DIAGNOSIS — E785 Hyperlipidemia, unspecified: Secondary | ICD-10-CM

## 2018-01-03 DIAGNOSIS — R7303 Prediabetes: Secondary | ICD-10-CM | POA: Diagnosis not present

## 2018-01-03 LAB — LIPID PANEL
Cholesterol: 188 mg/dL (ref 0–200)
HDL: 53.2 mg/dL (ref >39.00)
LDL CALC: 113 mg/dL — AB (ref 0–99)
NONHDL: 134.35
Total CHOL/HDL Ratio: 4
Triglycerides: 107 mg/dL (ref 0.0–149.0)
VLDL: 21.4 mg/dL (ref 0.0–40.0)

## 2018-01-03 LAB — COMPREHENSIVE METABOLIC PANEL
ALT: 14 U/L (ref 0–35)
AST: 15 U/L (ref 0–37)
Albumin: 4 g/dL (ref 3.5–5.2)
Alkaline Phosphatase: 71 U/L (ref 39–117)
BUN: 16 mg/dL (ref 6–23)
CHLORIDE: 105 meq/L (ref 96–112)
CO2: 31 mEq/L (ref 19–32)
Calcium: 9 mg/dL (ref 8.4–10.5)
Creatinine, Ser: 0.93 mg/dL (ref 0.40–1.20)
GFR: 76.45 mL/min (ref >60.00)
GLUCOSE: 90 mg/dL (ref 70–99)
POTASSIUM: 3.5 meq/L (ref 3.5–5.1)
SODIUM: 140 meq/L (ref 135–145)
TOTAL PROTEIN: 6.6 g/dL (ref 6.0–8.3)
Total Bilirubin: 0.8 mg/dL (ref 0.2–1.2)

## 2018-01-03 LAB — HEMOGLOBIN A1C: HEMOGLOBIN A1C: 6.2 % (ref 4.6–6.5)

## 2018-01-06 ENCOUNTER — Ambulatory Visit
Admission: RE | Admit: 2018-01-06 | Discharge: 2018-01-06 | Disposition: A | Discharge status: Home / Self Care | Payer: Medicare HMO | Source: Ambulatory Visit | Attending: Internal Medicine | Admitting: Internal Medicine

## 2018-01-06 DIAGNOSIS — E049 Nontoxic goiter, unspecified: Secondary | ICD-10-CM | POA: Diagnosis not present

## 2018-01-06 DIAGNOSIS — E042 Nontoxic multinodular goiter: Secondary | ICD-10-CM | POA: Diagnosis not present

## 2018-01-13 ENCOUNTER — Telehealth: Payer: Self-pay

## 2018-01-13 NOTE — Telephone Encounter (Signed)
Patient notified of lab results and she states that she does not want to go on any medication for the cholesterol right now unless Dr. Shirlee Latch absolutely recommends it. She states that she would like to wait another 3 months and work on diet and exercise to see if she can get it back to where it needs to be.

## 2018-01-13 NOTE — Telephone Encounter (Signed)
We can recheck at f/u please be fasting  No food only water and medications x 12 hours   tMS

## 2018-01-16 NOTE — Telephone Encounter (Signed)
Called and left voicemail for patient that Dr. Shirlee Latch will be checking her labs on the day of the appointment and that she should be fasting no food , only water and medications 12 hours prior to visit.

## 2018-01-18 ENCOUNTER — Other Ambulatory Visit: Payer: Self-pay | Admitting: Internal Medicine

## 2018-01-18 DIAGNOSIS — E041 Nontoxic single thyroid nodule: Secondary | ICD-10-CM

## 2018-02-09 DIAGNOSIS — H2513 Age-related nuclear cataract, bilateral: Secondary | ICD-10-CM | POA: Diagnosis not present

## 2018-02-13 DIAGNOSIS — E042 Nontoxic multinodular goiter: Secondary | ICD-10-CM | POA: Diagnosis not present

## 2018-02-20 DIAGNOSIS — E042 Nontoxic multinodular goiter: Secondary | ICD-10-CM | POA: Diagnosis not present

## 2018-02-21 DIAGNOSIS — E042 Nontoxic multinodular goiter: Secondary | ICD-10-CM | POA: Diagnosis not present

## 2018-03-01 DIAGNOSIS — E049 Nontoxic goiter, unspecified: Secondary | ICD-10-CM | POA: Diagnosis not present

## 2018-05-09 ENCOUNTER — Other Ambulatory Visit: Payer: Self-pay | Admitting: Internal Medicine

## 2018-05-09 ENCOUNTER — Encounter: Payer: Self-pay | Admitting: Internal Medicine

## 2018-05-09 ENCOUNTER — Ambulatory Visit (INDEPENDENT_AMBULATORY_CARE_PROVIDER_SITE_OTHER): Payer: Medicare HMO | Admitting: Internal Medicine

## 2018-05-09 VITALS — BP 120/68 | HR 66 | Temp 98.4°F | Ht 67.0 in | Wt 204.1 lb

## 2018-05-09 DIAGNOSIS — M79671 Pain in right foot: Secondary | ICD-10-CM | POA: Insufficient documentation

## 2018-05-09 DIAGNOSIS — E785 Hyperlipidemia, unspecified: Secondary | ICD-10-CM

## 2018-05-09 DIAGNOSIS — I10 Essential (primary) hypertension: Secondary | ICD-10-CM

## 2018-05-09 DIAGNOSIS — M79673 Pain in unspecified foot: Secondary | ICD-10-CM

## 2018-05-09 HISTORY — DX: Pain in unspecified foot: M79.673

## 2018-05-09 HISTORY — DX: Pain in right foot: M79.671

## 2018-05-09 LAB — BASIC METABOLIC PANEL
BUN: 19 mg/dL (ref 6–23)
CHLORIDE: 106 meq/L (ref 96–112)
CO2: 31 mEq/L (ref 19–32)
CREATININE: 1.04 mg/dL (ref 0.40–1.20)
Calcium: 9.8 mg/dL (ref 8.4–10.5)
GFR: 67.13 mL/min (ref >60.00)
Glucose, Bld: 95 mg/dL (ref 70–99)
POTASSIUM: 3.9 meq/L (ref 3.5–5.1)
Sodium: 142 mEq/L (ref 135–145)

## 2018-05-09 LAB — LIPID PANEL
CHOL/HDL RATIO: 4
CHOLESTEROL: 204 mg/dL — AB (ref 0–200)
HDL: 55.1 mg/dL (ref >39.00)
LDL CALC: 131 mg/dL — AB (ref 0–99)
NonHDL: 148.51
TRIGLYCERIDES: 90 mg/dL (ref 0.0–149.0)
VLDL: 18 mg/dL (ref 0.0–40.0)

## 2018-05-09 MED ORDER — ATORVASTATIN CALCIUM 10 MG PO TABS
10.0000 mg | ORAL_TABLET | Freq: Every day | ORAL | 3 refills | Status: DC
Start: 2018-05-09 — End: 2018-07-21

## 2018-05-09 MED ORDER — TELMISARTAN-HCTZ 80-25 MG PO TABS: 1.0000 | ORAL_TABLET | Freq: Every day | ORAL | 3 refills | Status: DC

## 2018-05-09 NOTE — Progress Notes (Signed)
Pre visit review using our clinic review tool, if applicable. No additional management support is needed unless otherwise documented below in the visit note. 

## 2018-05-09 NOTE — Progress Notes (Signed)
Chief Complaint  Patient presents with  . Follow-up   F/u 1. HTN controlled on micardis 80-25 mg qd  2. HLD agreeable to statin if cholesterol not improved  3. Thyroid bx normal 02/20/18 negative eye exam 2019 ok  4. C/o right foot and heel pain w/in the last 1-2 months   Review of Systems  Constitutional: Negative for weight loss.  HENT: Negative for hearing loss.   Respiratory: Negative for shortness of breath.   Cardiovascular: Negative for chest pain.  Musculoskeletal: Positive for joint pain.  Skin: Negative for rash.   Past Medical History:  Diagnosis Date  . Blood transfusion without reported diagnosis   . Cataract    ? which eye wears glasses   . Hypertension   . Prediabetes    Past Surgical History:  Procedure Laterality Date  . ABDOMINAL HYSTERECTOMY     over 30 years fibroids no h/o abnormal pap    Family History  Problem Relation Age of Onset  . Arthritis Mother   . Diabetes Mother   . Hypertension Mother   . Alcohol abuse Father   . Arthritis Father    Social History   Socioeconomic History  . Marital status: Single    Spouse name: Not on file  . Number of children: 0  . Years of education: Not on file  . Highest education level: Not on file  Occupational History  . Not on file  Social Needs  . Financial resource strain: Not on file  . Food insecurity:    Worry: Not on file    Inability: Not on file  . Transportation needs:    Medical: Not on file    Non-medical: Not on file  Tobacco Use  . Smoking status: Former Games developer  . Smokeless tobacco: Never Used  . Tobacco comment: in 30s smoked x 2 years max 4-5 cig/day  Substance and Sexual Activity  . Alcohol use: No    Frequency: Never  . Drug use: No  . Sexual activity: Never  Lifestyle  . Physical activity:    Days per week: Not on file    Minutes per session: Not on file  . Stress: Not on file  Relationships  . Social connections:    Talks on phone: Not on file    Gets together: Not  on file    Attends religious service: Not on file    Active member of club or organization: Not on file    Attends meetings of clubs or organizations: Not on file    Relationship status: Not on file  . Intimate partner violence:    Fear of current or ex partner: Not on file    Emotionally abused: Not on file    Physically abused: Not on file    Forced sexual activity: Not on file  Other Topics Concern  . 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 Cholecalciferol (VITAMIN D3) 1000 units CAPS Take by mouth.  . cyanocobalamin 1000 MCG tablet Take 1,000 mcg by mouth daily.  . Multiple Vitamins-Minerals (MULTIVITAMIN WOMEN) TABS Take by mouth daily.  Marland Kitchen telmisartan-hydrochlorothiazide (MICARDIS HCT) 80-25 MG tablet Take 1 tablet by mouth daily. In am   Allergies  Allergen Reactions  . Penicillins  Vaginal infections   No results found for this or any previous visit (from the past 2160 hour(s)). Objective  Body mass index is 31.97 kg/m. Wt Readings from Last 3 Encounters:  05/09/18 204 lb 1.6 oz (92.6 kg)  12/29/17 203 lb 12.8 oz (92.4 kg)  10/26/17 201 lb 12.8 oz (91.5 kg)   Temp Readings from Last 3 Encounters:  05/09/18 98.4 F (36.9 C) (Oral)  12/29/17 98.4 F (36.9 C) (Oral)  10/26/17 98.2 F (36.8 C) (Oral)   BP Readings from Last 3 Encounters:  05/09/18 120/68  12/29/17 128/70  10/26/17 130/84   Pulse Readings from Last 3 Encounters:  05/09/18 66  12/29/17 75  10/26/17 73    Physical Exam  Constitutional: She is oriented to person, place, and time. Vital signs are normal. She appears well-developed and well-nourished. She is cooperative.  HENT:  Head: Normocephalic and atraumatic.  Mouth/Throat: Oropharynx is clear and moist and mucous membranes are normal.  Eyes:  Pupils are equal, round, and reactive to light. Conjunctivae are normal.  Cardiovascular: Normal rate, regular rhythm and normal heart sounds.  Pulmonary/Chest: Effort normal and breath sounds normal.  Neurological: She is alert and oriented to person, place, and time. Gait normal.  Skin: Skin is warm, dry and intact.  Psychiatric: She has a normal mood and affect. Her speech is normal and behavior is normal. Judgment and thought content normal. Cognition and memory are normal.  Nursing note and vitals reviewed.   Assessment   1. HTN/HLD 2. Right midfoot heel pain ? Arthritis vs PF  3. HM Plan   1. Cont meds  Check lipid bmet today agreeable to low dose statin ascvd risk score 10.6 % today  2. Consider right foot Xray today  3.  Declines flu shot  Disc other vaccines though pt doesn't really like vaccines (Tdap >10 years, shingrix, prevnar and pna 23) Prev given info about vaccinestoday Tdap, hep b vaccine -pt declined Hep B vaccine   Hascologaurdnot done yet encouraged call to reactivate  Out of pap window s/p hysterectomy no h/o abnormal pap  mammo 11/30/17 negative  DEXA 12/08/16 osteopenia rec Calcium 600 mg bid and vit D3 1000 IU qd. rec repeat in 3-5 years.  Former smoker in 30s x 2 years max 4-5 cig per day.  H/o cataracts referred to [facility] Dr. Dorcas Mcmurrayingledein saw 2019 eyes ok     Provider: Dr. French Anaracy McLean-Scocuzza-Internal Medicine

## 2018-05-09 NOTE — Patient Instructions (Addendum)
F/u in 6 months   Let me know if you want a right foot Xray  Cholesterol Cholesterol is a white, waxy, fat-like substance that is needed by the human body in small amounts. The liver makes all the cholesterol we need. Cholesterol is carried from the liver by the blood through the blood vessels. Deposits of cholesterol (plaques) may build up on blood vessel (artery) walls. Plaques make the arteries narrower and stiffer. Cholesterol plaques increase the risk for heart attack and stroke. You cannot feel your cholesterol level even if it is very high. The only way to know that it is high is to have a blood test. Once you know your cholesterol levels, you should keep a record of the test results. Work with your health care provider to keep your levels in the desired range. What do the results mean?  Total cholesterol is a rough measure of all the cholesterol in your blood.  LDL (low-density lipoprotein) is the "bad" cholesterol. This is the type that causes plaque to build up on the artery walls. You want this level to be low.  HDL (high-density lipoprotein) is the "good" cholesterol because it cleans the arteries and carries the LDL away. You want this level to be high.  Triglycerides are fat that the body can either burn for energy or store. High levels are closely linked to heart disease. What are the desired levels of cholesterol?  Total cholesterol below 200.  LDL below 100 for people who are at risk, below 70 for people at very high risk.  HDL above 40 is good. A level of 60 or higher is considered to be protective against heart disease.  Triglycerides below 150. How can I lower my cholesterol? Diet Follow your diet program as told by your health care provider.  Choose fish or white meat chicken and [place], roasted or baked. Limit fatty cuts of red meat, fried foods, and processed meats, such as sausage and lunch meats.  Eat lots of fresh fruits and vegetables.  Choose whole grains,  beans, pasta, potatoes, and cereals.  Choose olive oil, corn oil, or canola oil, and use only small amounts.  Avoid butter, mayonnaise, shortening, or palm kernel oils.  Avoid foods with trans fats.  Drink skim or nonfat milk and eat low-fat or nonfat yogurt and cheeses. Avoid whole milk, cream, ice cream, egg yolks, and full-fat cheeses.  Healthier desserts include angel food cake, ginger snaps, animal crackers, hard candy, popsicles, and low-fat or nonfat frozen yogurt. Avoid pastries, cakes, pies, and cookies.  Exercise  Follow your exercise program as told by your health care provider. A regular program: ? Helps to decrease LDL and raise HDL. ? Helps with weight control.  Do things that increase your activity level, such as gardening, walking, and taking the stairs.  Ask your health care provider about ways that you can be more active in your daily life.  Medicine  Take over-the-counter and prescription medicines only as told by your health care provider. ? Medicine may be prescribed by your health care provider to help lower cholesterol and decrease the risk for heart disease. This is usually done if diet and exercise have failed to bring down cholesterol levels. ? If you have several risk factors, you may need medicine even if your levels are normal.  This information is not intended to replace advice given to you by your health care provider. Make sure you discuss any questions you have with your health care provider. Document Released:  05/25/2001 Document Revised: 03/27/2016 Document Reviewed: 02/28/2016 Elsevier Interactive Patient Education  2018 Elsevier Inc.  Plantar Fasciitis Plantar fasciitis is a painful foot condition that affects the heel. It occurs when the band of tissue that connects the toes to the heel bone (plantar fascia) becomes irritated. This can happen after exercising too much or doing other repetitive activities (overuse injury). The pain from plantar  fasciitis can range from mild irritation to severe pain that makes it difficult for you to walk or move. The pain is usually worse in the morning or after you have been sitting or lying down for a while. What are the causes? This condition may be caused by:  Standing for long periods of time.  Wearing shoes that do not fit.  Doing high-impact activities, including running, aerobics, and ballet.  Being overweight.  Having an abnormal way of walking (gait).  Having tight calf muscles.  Having high arches in your feet.  Starting a new athletic activity.  What are the signs or symptoms? The main symptom of this condition is heel pain. Other symptoms include:  Pain that gets worse after activity or exercise.  Pain that is worse in the morning or after resting.  Pain that goes away after you walk for a few minutes.  How is this diagnosed? This condition may be diagnosed based on your signs and symptoms. Your health care provider will also do a physical exam to check for:  A tender area on the bottom of your foot.  A high arch in your foot.  Pain when you move your foot.  Difficulty moving your foot.  You may also need to have imaging studies to confirm the diagnosis. These can include:  X-rays.  Ultrasound.  MRI.  How is this treated? Treatment for plantar fasciitis depends on the severity of the condition. Your treatment may include:  Rest, ice, and over-the-counter pain medicines to manage your pain.  Exercises to stretch your calves and your plantar fascia.  A splint that holds your foot in a stretched, upward position while you sleep (night splint).  Physical therapy to relieve symptoms and prevent problems in the future.  Cortisone injections to relieve severe pain.  Extracorporeal shock wave therapy (ESWT) to stimulate damaged plantar fascia with electrical impulses. It is often used as a last resort before surgery.  Surgery, if other treatments have not  worked after 12 months.  Follow these instructions at home:  Take medicines only as directed by your health care provider.  Avoid activities that cause pain.  Roll the bottom of your foot over a bag of ice or a bottle of cold water. Do this for 20 minutes, 3-4 times a day.  Perform simple stretches as directed by your health care provider.  Try wearing athletic shoes with air-sole or gel-sole cushions or soft shoe inserts.  Wear a night splint while sleeping, if directed by your health care provider.  Keep all follow-up appointments with your health care provider. How is this prevented?  Do not perform exercises or activities that cause heel pain.  Consider finding low-impact activities if you continue to have problems.  Lose weight if you need to. The best way to prevent plantar fasciitis is to avoid the activities that aggravate your plantar fascia. Contact a health care provider if:  Your symptoms do not go away after treatment with home care measures.  Your pain gets worse.  Your pain affects your ability to move or do your daily activities. This information  is not intended to replace advice given to you by your health care provider. Make sure you discuss any questions you have with your health care provider. Document Released: 05/25/2001 Document Revised: 02/02/2016 Document Reviewed: 07/10/2014 Elsevier Interactive Patient Education  2018 Elsevier Inc.  Heel Spur A heel spur is a bony growth that forms on the bottom of your heel bone (calcaneus). Heel spurs are common and do not always cause pain. However, heel spurs often cause inflammation in the strong band of tissue that runs underneath the bone of your foot (plantar fascia). When this happens, you may feel pain on the bottom of your foot, near your heel. What are the causes? The cause of heel spurs is not completely understood. They may be caused by pressure on the heel. Or, they may stem from the muscle attachments  (tendons) near the spur pulling on the heel. What increases the risk? You may be at risk for a heel spur if you:  Are older than 40.  Are overweight.  Have wear and tear arthritis (osteoarthritis).  Have plantar fascia inflammation.  What are the signs or symptoms? Some people have heel spurs but no symptoms. If you do have symptoms, they may include:  Pain in the bottom of your heel.  Pain that is worse when you first get out of bed.  Pain that gets worse after walking or standing.  How is this diagnosed? Your health care provider may diagnose a heel spur based on your symptoms and a physical exam. You may also have an X-ray of your foot to check for a bony growth coming from the calcaneus. How is this treated? Treatment aims to relieve the pain from the heel spur. This may include:  Stretching exercises.  Losing weight.  Wearing specific shoes, inserts, or orthotics for comfort and support.  Wearing splints at night to properly position your feet.  Taking over-the-counter medicine to relieve pain.  Being treated with high-intensity sound waves to break up the heel spur (extracorporeal shock wave therapy).  Getting steroid injections in your heel to reduce swelling and ease pain.  Having surgery if your heel spur causes long-term (chronic) pain.  Follow these instructions at home:  Take medicines only as directed by your health care provider.  Ask your health care provider if you should use ice or cold packs on the painful areas of your heel or foot.  Avoid activities that cause you pain until you recover or as directed by your health care provider.  Stretch before exercising or being physically active.  Wear supportive shoes that fit well as directed by your health care provider. You might need to buy new shoes. Wearing old shoes or shoes that do not fit correctly may not provide the support that you need.  Lose weight if your health care provider thinks you  should. This can relieve pressure on your foot that may be causing pain and discomfort. Contact a health care provider if:  Your pain continues or gets worse. This information is not intended to replace advice given to you by your health care provider. Make sure you discuss any questions you have with your health care provider. Document Released: 10/06/2005 Document Revised: 02/05/2016 Document Reviewed: 10/31/2013 Elsevier Interactive Patient Education  [employer]2018 Elsevier Inc.

## 2018-06-23 ENCOUNTER — Ambulatory Visit (INDEPENDENT_AMBULATORY_CARE_PROVIDER_SITE_OTHER): Payer: Medicare HMO

## 2018-06-23 VITALS — BP 118/70 | HR 69 | Temp 98.5°F | Resp 15 | Ht 67.0 in | Wt 205.1 lb

## 2018-06-23 DIAGNOSIS — Z0000 Encounter for general adult medical examination without abnormal findings: Secondary | ICD-10-CM | POA: Diagnosis not present

## 2018-06-23 NOTE — Progress Notes (Signed)
Subjective:   Stacy Vasquez is a 72 y.o. female who presents for an Initial Medicare Annual Wellness Visit.  Review of Systems    No ROS.  Medicare Wellness Visit. Additional risk factors are reflected in the social history.  Cardiac Risk Factors include: advanced age (>53men, >16 women);hypertension     Objective:    Today's Vitals   06/23/18 1003  BP: 118/70  Pulse: 69  Resp: 15  Temp: 98.5 F (36.9 C)  TempSrc: Oral  SpO2: 94%  Weight: 205 lb 1.9 oz (93 kg)  Height: 5\' 7"  (1.702 m)   Body mass index is 32.13 kg/m.  Advanced Directives 06/23/2018  Does Patient Have a Medical Advance Directive? No  Does patient want to make changes to medical advance directive? Yes (MAU/Ambulatory/Procedural Areas - Information given)    Current Medications (verified) Outpatient Encounter Medications as of 06/23/2018  Medication Sig  . atorvastatin (LIPITOR) 10 MG tablet Take 1 tablet (10 mg total) by mouth daily at 6 PM.  . Cholecalciferol (VITAMIN D3) 1000 units CAPS Take by mouth.  . cyanocobalamin 1000 MCG tablet Take 1,000 mcg by mouth daily.  . Multiple Vitamins-Minerals (MULTIVITAMIN WOMEN) TABS Take by mouth daily.  Marland Kitchen telmisartan-hydrochlorothiazide (MICARDIS HCT) 80-25 MG tablet Take 1 tablet by mouth daily. In am   No facility-administered encounter medications on file as of 06/23/2018.     Allergies (verified) Penicillins   History: Past Medical History:  Diagnosis Date  . Blood transfusion without reported diagnosis   . Cataract    ? which eye wears glasses   . Hyperlipidemia   . Hypertension   . Prediabetes    Past Surgical History:  Procedure Laterality Date  . ABDOMINAL HYSTERECTOMY     over 30 years fibroids no h/o abnormal pap    Family History  Problem Relation Age of Onset  . Arthritis Mother   . Diabetes Mother   . Hypertension Mother   . Alcohol abuse Father   . Arthritis Father   . Dementia Father   . Stroke Sister   . Congestive Heart  Failure Sister    Social History   Socioeconomic History  . Marital status: Single    Spouse name: Not on file  . Number of children: 0  . Years of education: Not on file  . Highest education level: Not on file  Occupational History  . Not on file  Social Needs  . Financial resource strain: Not hard at all  . Food insecurity:    Worry: Never true    Inability: Never true  . Transportation needs:    Medical: No    Non-medical: No  Tobacco Use  . Smoking status: Former Games developer  . Smokeless tobacco: Never Used  . Tobacco comment: in 30s smoked x 2 years max 4-5 cig/day  Substance and Sexual Activity  . Alcohol use: No    Frequency: Never  . Drug use: No  . Sexual activity: Never  Lifestyle  . Physical activity:    Days per week: Not on file    Minutes per session: Not on file  . Stress: Not at all  Relationships  . Social connections:    Talks on phone: Not on file    Gets together: Not on file    Attends religious service: Not on file    Active member of club or organization: Not on file    Attends meetings of clubs or organizations: Not on file    Relationship status: Not  on file  Other Topics Concern  . 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[place]    No kids, 5 siblings she is 3rd of 5 children     Tobacco Counseling Counseling given: Not Answered Comment: in 30s smoked x 2 years max 4-5 cig/day   Clinical Intake:  Pre-visit preparation completed: Yes  Pain : No/denies pain     Nutritional Status: BMI > 30  Obese Diabetes: No  How often do you need to have someone help you when you read instructions, pamphlets, or other written materials from your doctor or pharmacy?: 1 - Never  Interpreter Needed?: No      Activities of Daily Living In your present state of health, do you have any difficulty performing the following activities: 06/23/2018   Hearing? N  Vision? N  Difficulty concentrating or making decisions? N  Walking or climbing stairs? Y  Comment Knee pain when going up stairs, intermittent. Long history of bowling. No follow up desired at this time.   Dressing or bathing? N  Doing errands, shopping? N  Preparing Food and eating ? N  Using the Toilet? N  In the past six months, have you accidently leaked urine? N  Do you have problems with loss of bowel control? N  Managing your Medications? N  Managing your Finances? N  Housekeeping or managing your Housekeeping? N  Some recent data might be hidden     Immunizations and Health Maintenance  There is no immunization history on file for this patient. Health Maintenance Due  Topic Date Due  . Fecal DNA (Cologuard)  01/30/1997    Patient Care Team: McLean-Scocuzza, Pasty Spillers, MD as PCP - General (Internal Medicine)  Indicate any recent Medical Services you may have received from other than [facility] providers in the past year (date may be approximate).     Assessment:   This is a routine wellness examination for Stacy Vasquez. The goal of the wellness visit is to assist the patient how to close the gaps in care and create a preventative care plan for the patient.   The roster of all physicians providing medical care to patient is listed in the Snapshot section of the chart.  Osteopenia. Taking calcium VIT D3 as appropriate/Osteoporosis risk reviewed.    Safety issues reviewed; Lives alone, life alert encouraged. Smoke and carbon monoxide detectors in the home. No firearms in the home. Wears seatbelts when driving or riding with others. No violence in the home.    She stays active by working in her yard most days. Discussed the need for sun protection: hats, long sleeves and the use of sunscreen if there is significant sun exposure.  No new identified risk were noted.  No failures at ADL's or IADL's.   BMI- discussed the importance of a healthy diet, water intake and  the benefits of aerobic exercise.She tries to have a healthy diet with small portions, plans to increase her activity by walking for exercise and has adequate water intake.     Dental- every 6 months.  Eye- Visual acuity not assessed per patient preference since they have regular follow up with the ophthalmologist.  Wears corrective lenses.  Sleep patterns- Sleeps well through the night.   Influenza vaccine declined.   Cologuard encouraged.   Patient Concerns: None at this time. Follow up with PCP as needed.  Hearing/Vision screen Hearing  Screening Comments: Patient is able to hear conversational tones without difficulty.  No issues reported.   Vision Screening Comments: Followed [facility] Wears corrective lenses Last OV 05/2018 Visual acuity not assessed per patient preference since they have regular follow up with the ophthalmologist  Dietary issues and exercise activities discussed: Current Exercise Habits: The patient does not participate in regular exercise at present  Goals      Patient Stated   . DIET - INCREASE LEAN PROTEINS (pt-stated)     Low carb diet Protein supplemental drink to replace a meal Lose weight       Other   . Increase physical activity     Walk for exercise      Depression Screen PHQ 2/9 Scores 05/09/2018 12/29/2017 10/26/2017  PHQ - 2 Score 0 0 0    Fall Risk Fall Risk  05/09/2018 12/29/2017 10/26/2017  Falls in the past year? No No No  Cognitive Function:     6CIT Screen 06/23/2018  What Year? 0 points  What month? 0 points  What time? 0 points  Count back from 20 0 points  Months in reverse 0 points  Repeat phrase 0 points  Total Score 0    Screening Tests Health Maintenance  Topic Date Due  . Fecal DNA (Cologuard)  01/30/1997  . TETANUS/TDAP  10/02/2018 (Originally 01/30/1966)  . PNA vac Low Risk Adult (1 of 2 - PCV13) 10/02/2018 (Originally 01/31/2012)  . INFLUENZA VACCINE  05/15/2019 (Originally 04/13/2018)  .  MAMMOGRAM  12/01/2019  . DEXA SCAN  Completed  . Hepatitis C Screening  Completed     Plan:   End of life planning; Advance aging; Advanced directives discussed. Copy of current HCPOA/Living Will requested upon completion.    I have personally reviewed and noted the following in the patient's chart:   . Medical and social history . Use of alcohol, tobacco or illicit drugs  . Current medications and supplements . Functional ability and status . Nutritional status . Physical activity . Advanced directives . List of other physicians . Hospitalizations, surgeries, and ER visits in previous 12 months . Vitals . Screenings to include cognitive, depression, and falls . Referrals and appointments  In addition, I have reviewed and discussed with patient certain preventive protocols, quality metrics, and best practice recommendations. A written personalized care plan for preventive services as well as general preventive health recommendations were provided to patient.     Ashok Pall, LPN   16/06/[id number]

## 2018-06-23 NOTE — Patient Instructions (Addendum)
  Stacy Vasquez , Thank you for taking time to come for your Medicare Wellness Visit. I appreciate your ongoing commitment to your health goals. Please review the following plan we discussed and let me know if I can assist you in the future.   Follow up as needed.    Bring a copy of your Health Care Power of Attorney and/or Living Will to be scanned into chart upon completion  Have a great day!  These are the goals we discussed: Goals      Patient Stated   . DIET - INCREASE LEAN PROTEINS (pt-stated)     Low carb diet Protein supplemental drink to replace a meal Lose weight       Other   . Increase physical activity     Walk for exercise       This is a list of the screening recommended for you and due dates:  Health Maintenance  Topic Date Due  . Cologuard (Stool DNA test)  01/30/1997  . Tetanus Vaccine  10/02/2018*  . Pneumonia vaccines (1 of 2 - PCV13) 10/02/2018*  . Flu Shot  05/15/2019*  . Mammogram  12/01/2019  . DEXA scan (bone density measurement)  Completed  .  Hepatitis C: One time screening is recommended by Center for Disease Control  (CDC) for  adults born from [birth year] through 1964.   Completed  *Topic was postponed. The date shown is not the original due date.

## 2018-06-23 NOTE — Progress Notes (Signed)
Agree   TMS 

## 2018-07-21 ENCOUNTER — Other Ambulatory Visit: Payer: Self-pay | Admitting: Internal Medicine

## 2018-07-21 ENCOUNTER — Telehealth: Payer: Self-pay | Admitting: Internal Medicine

## 2018-07-21 DIAGNOSIS — E785 Hyperlipidemia, unspecified: Secondary | ICD-10-CM

## 2018-07-21 MED ORDER — PRAVASTATIN SODIUM 20 MG PO TABS: 20.0000 mg | ORAL_TABLET | Freq: Every day | ORAL | 0 refills | Status: DC

## 2018-07-21 NOTE — Telephone Encounter (Signed)
Please advise 

## 2018-07-21 NOTE — Telephone Encounter (Signed)
Lets try pravastatin instead of lipitor   TMS

## 2018-07-21 NOTE — Telephone Encounter (Signed)
Copied from CRM [id number]. Topic: Quick Communication - See Telephone Encounter >> Jul 21, 2018  3:11 PM Jens Som A wrote: CRM for notification. See Telephone encounter for: 07/21/18.  Patient is calling regarding her atorvastatin (LIPITOR) 10 MG tablet [[id number]] . Patient has been expiercing side effects of her feet, hands, ankles swelling. Patient stopped taking the atorvastatin (LIPITOR) 10 MG tablet [[id number]] . Patient has been off the medicine for a week. At this time, patient is requesting a new medicine for her cholesterol. Please advise [phone]

## 2018-07-24 NOTE — Telephone Encounter (Signed)
Patient has been informed and she started this past saturday.

## 2018-08-16 ENCOUNTER — Other Ambulatory Visit: Payer: Self-pay | Admitting: Internal Medicine

## 2018-08-16 DIAGNOSIS — E785 Hyperlipidemia, unspecified: Secondary | ICD-10-CM

## 2018-08-16 MED ORDER — PRAVASTATIN SODIUM 20 MG PO TABS: 20.0000 mg | ORAL_TABLET | Freq: Every day | ORAL | 0 refills | Status: DC

## 2018-10-25 ENCOUNTER — Telehealth: Payer: Self-pay | Admitting: Internal Medicine

## 2018-10-25 NOTE — Telephone Encounter (Signed)
Insurance will no longer cover telmisartan/hctz 80-25 and we need to change therapy is she agreeable?   TMS

## 2018-10-27 NOTE — Telephone Encounter (Signed)
Patient states her insurance company has never paid for this medication, and she has just recently picked it up.  / She has an upcoming appointment on 11-09-2018 and she will discuss with the provider at that time. / Patient states she takes this particular medication because it does not make her hands/feet swell. /

## 2018-10-27 NOTE — Telephone Encounter (Signed)
Attempted to call patient- left message for her to call back regarding medication request

## 2018-10-27 NOTE — Telephone Encounter (Signed)
Left message for patient to return call back. [facility] may give and obtain information.  

## 2018-11-09 ENCOUNTER — Ambulatory Visit: Payer: Medicare HMO | Admitting: Internal Medicine

## 2018-11-13 ENCOUNTER — Other Ambulatory Visit: Payer: Self-pay | Admitting: Internal Medicine

## 2018-11-13 DIAGNOSIS — Z1231 Encounter for screening mammogram for malignant neoplasm of breast: Secondary | ICD-10-CM

## 2018-11-16 ENCOUNTER — Other Ambulatory Visit: Payer: Self-pay | Admitting: Internal Medicine

## 2018-11-16 DIAGNOSIS — E785 Hyperlipidemia, unspecified: Secondary | ICD-10-CM

## 2018-11-16 MED ORDER — PRAVASTATIN SODIUM 20 MG PO TABS: 20.0000 mg | ORAL_TABLET | Freq: Every day | ORAL | 3 refills | Status: DC

## 2018-11-16 MED ORDER — PRAVASTATIN SODIUM 20 MG PO TABS: 20.0000 mg | ORAL_TABLET | Freq: Every day | ORAL | 0 refills | Status: DC

## 2018-11-16 NOTE — Telephone Encounter (Signed)
Requested Prescriptions  Pending Prescriptions Disp Refills  . pravastatin (PRAVACHOL) 20 MG tablet 90 tablet 0    Sig: Take 1 tablet (20 mg total) by mouth at bedtime.     Cardiovascular:  Antilipid - Statins Failed - 11/16/2018 10:12 AM      Failed - Total Cholesterol in normal range and within 360 days    Cholesterol  Date Value Ref Range Status  05/09/2018 204 (H) 0 - 200 mg/dL Final    Comment:    ATP III Classification       Desirable:  < 200 mg/dL               Borderline High:  200 - 239 mg/dL          High:  > = 268 mg/dL         Failed - LDL in normal range and within 360 days    LDL Cholesterol  Date Value Ref Range Status  05/09/2018 131 (H) 0 - 99 mg/dL Final         Passed - HDL in normal range and within 360 days    HDL  Date Value Ref Range Status  05/09/2018 55.10 >39.00 mg/dL Final         Passed - Triglycerides in normal range and within 360 days    Triglycerides  Date Value Ref Range Status  05/09/2018 90.0 0.0 - 149.0 mg/dL Final    Comment:    Normal:  <150 mg/dLBorderline High:  150 - 199 mg/dL         Passed - Patient is not pregnant      Passed - Valid encounter within last 12 months    Recent Outpatient Visits          6 months ago Essential hypertension   [facility] McLean-Scocuzza, Pasty Spillers, MD   10 months ago Encounter for annual general medical examination with abnormal findings in adult   [facility] [place] McLean-Scocuzza, Pasty Spillers, MD   1 year ago Essential hypertension   [facility] [place] McLean-Scocuzza, Pasty Spillers, MD   1 year ago Essential hypertension   [facility] [place] McLean-Scocuzza, Pasty Spillers, MD      Future Appointments            In 3 weeks McLean-Scocuzza, Pasty Spillers, MD [facility], [facility]   In 7 months O'Brien-Blaney, Denisa L, LPN [facility] [place], [facility]   In 7 months McLean-Scocuzza, Pasty Spillers, MD [facility], [facility]

## 2018-11-16 NOTE — Telephone Encounter (Signed)
Copied from CRM [id number]. Topic: Quick Communication - Rx Refill/Question >> Nov 16, 2018  9:55 AM Lynne Logan D wrote: Medication: pravastatin (PRAVACHOL) 20 MG tablet / Pt stated she spoke with someone at pharmacy for refill request however one was not in the system.  Has the patient contacted their pharmacy? Yes.   (Agent: If no, request that the patient contact the pharmacy for the refill.) (Agent: If yes, when and what did the pharmacy advise?)  Preferred Pharmacy (with phone number or street name): [id number] - [place], [place] - [address] AT [facility] [address] & [place] [phone] (Phone) [phone] (Fax)    Agent: Please be advised that RX refills may take up to 3 business days. We ask that you follow-up with your pharmacy.

## 2018-12-07 ENCOUNTER — Ambulatory Visit: Payer: Medicare HMO | Admitting: Internal Medicine

## 2019-01-01 ENCOUNTER — Other Ambulatory Visit: Payer: Self-pay | Admitting: Internal Medicine

## 2019-01-01 ENCOUNTER — Telehealth: Payer: Self-pay

## 2019-01-01 DIAGNOSIS — M79673 Pain in unspecified foot: Secondary | ICD-10-CM

## 2019-01-01 NOTE — Telephone Encounter (Signed)
-----   Message from Bevelyn Buckles, MD sent at 01/01/2019  9:21 AM EDT ----- Sent referral please review with pt in the future needs appt for referrals but due to covid will place referral  She can try icing heel for now and reviewing on Web [facility] or [facility] stretches for the heel and insoles I.e Dr. Idelle Jo otc if heel pain is bad or if [facility] is [facility]  Advise not sure if podiatry is seeing pts in office but if so and appt made make sure she wears a mask even if cloth and also in public I.e grocery store    Are you in my box today I.e mycharts, prescriptions , calls or is Mal Amabile at work?   Thank you  TMS ----- Message ----- From: Allean Found, CMA Sent: 01/01/2019   9:05 AM EDT To: Pasty Spillers McLean-Scocuzza, MD  I called to change this pt's appointment for her physical to a virtual /telephone visit and she cancelled the appt for this month stating it was a physical and she wanted it all BP check, listen to her lungs, and blood work, I informed her her that our lab is open and we are only taking in 2 at a time for her safety,She stated she had a issue with her heel and she wanted a referral to a podiatrist or someone about it I explained if she would keep her virtual visit you could discuss this and turn the visit into a follow up or a acute because of her heel, Pt would not she insisted I cancel and she wants  a referral put in without the virtual, please advise.  Coralee North, cma

## 2019-01-01 NOTE — Telephone Encounter (Signed)
Called and informed pt what the provider suggested about her heel and that the referral was placed. Pt understood.  Jianni Shelden,cma

## 2019-01-11 ENCOUNTER — Ambulatory Visit: Payer: Medicare HMO | Admitting: Internal Medicine

## 2019-01-30 ENCOUNTER — Other Ambulatory Visit: Payer: Self-pay | Admitting: Podiatry

## 2019-01-30 ENCOUNTER — Other Ambulatory Visit: Payer: Self-pay

## 2019-01-30 ENCOUNTER — Telehealth: Payer: Self-pay

## 2019-01-30 ENCOUNTER — Ambulatory Visit: Payer: Medicare HMO | Admitting: Podiatry

## 2019-01-30 ENCOUNTER — Encounter: Payer: Self-pay | Admitting: Podiatry

## 2019-01-30 ENCOUNTER — Ambulatory Visit (INDEPENDENT_AMBULATORY_CARE_PROVIDER_SITE_OTHER): Payer: Medicare HMO

## 2019-01-30 VITALS — Temp 97.8°F

## 2019-01-30 DIAGNOSIS — G8929 Other chronic pain: Secondary | ICD-10-CM

## 2019-01-30 DIAGNOSIS — M722 Plantar fascial fibromatosis: Secondary | ICD-10-CM

## 2019-01-30 DIAGNOSIS — M79671 Pain in right foot: Secondary | ICD-10-CM | POA: Diagnosis not present

## 2019-01-30 NOTE — Telephone Encounter (Signed)
Copied from CRM [id number]. Topic: General - Other >> Jan 30, 2019  1:45 PM Jaquita Rector A wrote: Reason for CRM: Patient called back to reschedule her appointment. Asking for a call back please at Ph# [phone]  Left message on voicemail for pt to call back to reschedule appt.  Nina,cma

## 2019-02-01 ENCOUNTER — Encounter: Payer: Medicare HMO | Admitting: Internal Medicine

## 2019-02-01 NOTE — Progress Notes (Signed)
   Subjective: 73 y.o. female presenting today as a new patient with a chief complaint of dull, aching, shooting pain in the medial right heel that began 6 months ago. She states the pain radiates into the arch. She states the pain is greatest in the morning when she first gets up in the morning. She notes throbbing pain while seated. She has used arch supports, massage ball, OTC inserts with no significant relief. Patient is here for further evaluation and treatment.   Past Medical History:  Diagnosis Date  . Blood transfusion without reported diagnosis   . Cataract    ? which eye wears glasses   . Hyperlipidemia   . Hypertension   . Prediabetes      Objective: Physical Exam General: The patient is alert and oriented x3 in no acute distress.  Dermatology: Skin is warm, dry and supple bilateral lower extremities. Negative for open lesions or macerations bilateral.   Vascular: Dorsalis Pedis and Posterior Tibial pulses palpable bilateral.  Capillary fill time is immediate to all digits.  Neurological: Epicritic and protective threshold intact bilateral.   Musculoskeletal: Tenderness to palpation to the plantar aspect of the right heel along the plantar fascia. All other joints range of motion within normal limits bilateral. Strength 5/5 in all groups bilateral.   Radiographic exam: Normal osseous mineralization. Joint spaces preserved. No fracture/dislocation/boney destruction. No other soft tissue abnormalities or radiopaque foreign bodies.   Assessment: 1. Plantar fasciitis right  Plan of Care:  1. Patient evaluated. Xrays reviewed.   2. Injection of 0.5cc Celestone soluspan injected into the right plantar fascia  3. Declined oral medications.  4. Plantar fascial band(s) dispensed. 5. Instructed patient regarding therapies and modalities at home to alleviate symptoms.  6. Return to clinic in 4 weeks.     Felecia Shelling, DPM [facility]  Dr. Felecia Shelling,  DPM    [address] [place], [place] [address]                Office [phone]  Fax [phone]

## 2019-03-02 ENCOUNTER — Ambulatory Visit: Payer: Medicare HMO | Admitting: Podiatry

## 2019-03-14 ENCOUNTER — Other Ambulatory Visit: Payer: Self-pay

## 2019-03-14 ENCOUNTER — Ambulatory Visit
Admission: RE | Admit: 2019-03-14 | Discharge: 2019-03-14 | Disposition: A | Discharge status: Home / Self Care | Payer: Medicare HMO | Source: Ambulatory Visit | Attending: Internal Medicine | Admitting: Internal Medicine

## 2019-03-14 DIAGNOSIS — Z1231 Encounter for screening mammogram for malignant neoplasm of breast: Secondary | ICD-10-CM | POA: Insufficient documentation

## 2019-03-20 ENCOUNTER — Ambulatory Visit: Payer: Medicare HMO | Admitting: Podiatry

## 2019-03-20 ENCOUNTER — Telehealth: Payer: Self-pay | Admitting: Internal Medicine

## 2019-03-20 NOTE — Telephone Encounter (Signed)
Pt would like to have fasting labs done before appt on 05/02/2019. Orders needed please and thank you!

## 2019-03-21 ENCOUNTER — Other Ambulatory Visit: Payer: Self-pay | Admitting: Internal Medicine

## 2019-03-21 DIAGNOSIS — E785 Hyperlipidemia, unspecified: Secondary | ICD-10-CM

## 2019-03-21 DIAGNOSIS — Z1329 Encounter for screening for other suspected endocrine disorder: Secondary | ICD-10-CM

## 2019-03-21 DIAGNOSIS — R7303 Prediabetes: Secondary | ICD-10-CM

## 2019-03-21 DIAGNOSIS — Z1389 Encounter for screening for other disorder: Secondary | ICD-10-CM

## 2019-03-21 DIAGNOSIS — I10 Essential (primary) hypertension: Secondary | ICD-10-CM

## 2019-03-21 NOTE — Telephone Encounter (Signed)
sch fasting labs before appt 8/19 1 week befor e

## 2019-03-22 ENCOUNTER — Encounter: Payer: Medicare HMO | Admitting: Internal Medicine

## 2019-03-22 NOTE — Telephone Encounter (Signed)
Called pt and left message on voicemail to call and schedule a lab appt  1 week prior to her appt on 05/02/2019

## 2019-03-23 ENCOUNTER — Encounter: Payer: Self-pay | Admitting: Podiatry

## 2019-03-23 ENCOUNTER — Ambulatory Visit (INDEPENDENT_AMBULATORY_CARE_PROVIDER_SITE_OTHER): Payer: Medicare HMO | Admitting: Podiatry

## 2019-03-23 ENCOUNTER — Other Ambulatory Visit: Payer: Self-pay

## 2019-03-23 VITALS — Temp 98.2°F

## 2019-03-23 DIAGNOSIS — M722 Plantar fascial fibromatosis: Secondary | ICD-10-CM

## 2019-03-26 NOTE — Progress Notes (Signed)
   Subjective: 73 y.o. female presenting today for follow up evaluation of plantar fasciitis of the right foot. She states the pain was improving and she was doing well until about one week ago. She states the pain has become worse and more constant. Being on her feet for long periods of time increases the pain. She has been trying to massage the foot for treatment. Patient is here for further evaluation and treatment.   Past Medical History:  Diagnosis Date  . Blood transfusion without reported diagnosis   . Cataract    ? which eye wears glasses   . Hyperlipidemia   . Hypertension   . Prediabetes      Objective: Physical Exam General: The patient is alert and oriented x3 in no acute distress.  Dermatology: Skin is warm, dry and supple bilateral lower extremities. Negative for open lesions or macerations bilateral.   Vascular: Dorsalis Pedis and Posterior Tibial pulses palpable bilateral.  Capillary fill time is immediate to all digits.  Neurological: Epicritic and protective threshold intact bilateral.   Musculoskeletal: Tenderness to palpation to the plantar aspect of the right heel along the plantar fascia. All other joints range of motion within normal limits bilateral. Strength 5/5 in all groups bilateral.   Assessment: 1. Plantar fasciitis right - recurred   Plan of Care:  1. Patient evaluated.    2. Injection of 0.5cc Celestone soluspan injected into the right plantar fascia  3. Declined oral medications.  4. Plantar fascial band(s) dispensed. 5. Instructed patient regarding therapies and modalities at home to alleviate symptoms.  6. Return to clinic as needed.  Moved from [place], [place] two months ago.      Edrick Kins, DPM [facility]  Dr. Edrick Kins, DPM    [address], [place] [address]                Office [phone]  Fax [phone]

## 2019-03-28 ENCOUNTER — Other Ambulatory Visit: Payer: Self-pay

## 2019-03-28 ENCOUNTER — Ambulatory Visit (INDEPENDENT_AMBULATORY_CARE_PROVIDER_SITE_OTHER): Payer: Medicare HMO | Admitting: Internal Medicine

## 2019-03-28 DIAGNOSIS — I10 Essential (primary) hypertension: Secondary | ICD-10-CM | POA: Diagnosis not present

## 2019-03-28 DIAGNOSIS — R7303 Prediabetes: Secondary | ICD-10-CM

## 2019-03-28 DIAGNOSIS — H269 Unspecified cataract: Secondary | ICD-10-CM | POA: Diagnosis not present

## 2019-03-28 DIAGNOSIS — E785 Hyperlipidemia, unspecified: Secondary | ICD-10-CM | POA: Diagnosis not present

## 2019-03-28 DIAGNOSIS — Z8639 Personal history of other endocrine, nutritional and metabolic disease: Secondary | ICD-10-CM | POA: Insufficient documentation

## 2019-03-28 DIAGNOSIS — E041 Nontoxic single thyroid nodule: Secondary | ICD-10-CM | POA: Diagnosis not present

## 2019-03-28 MED ORDER — TELMISARTAN-HCTZ 80-25 MG PO TABS: 1.0000 | ORAL_TABLET | Freq: Every day | ORAL | 3 refills | Status: DC

## 2019-03-28 NOTE — Progress Notes (Signed)
Telephone Note  I connected with Stacy Vasquez  on 03/28/19 at  3:30 PM EDT by telephone and verified that I am speaking with the correct person using two identifiers.  Location patient: home Location provider:work Persons participating in the virtual visit: patient, provider  I discussed the limitations of evaluation and management by telemedicine and the availability of in person appointments. The patient expressed understanding and agreed to proceed.   HPI: 1. HTN controlled needs refill of micardis hctz 80-25 cost with [employer] is $200 for #90 day supply  2. HLD taking pravastatin and tolerating upcoming labs 04/25/19  3. Thyroid nodules FNA 02/20/18 normal rec f/u US in 6 months it has not been 1 year due to f/u with Endocrine   ROS: See pertinent positives and negatives per HPI.  Past Medical History:  Diagnosis Date  . Blood transfusion without reported diagnosis   . Cataract    ? which eye wears glasses   . Hyperlipidemia   . Hypertension   . Prediabetes     Past Surgical History:  Procedure Laterality Date  . ABDOMINAL HYSTERECTOMY     over 30 years fibroids no h/o abnormal pap     Family History  Problem Relation Age of Onset  . Arthritis Mother   . Diabetes Mother   . Hypertension Mother   . Alcohol abuse Father   . Arthritis Father   . Dementia Father   . Stroke Sister   . 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  Cholecalciferol (VITAMIN D3) 1000 units CAPS, Take by mouth., Disp: , Rfl:  .  cyanocobalamin 1000 MCG tablet, Take 1,000 mcg by mouth daily., Disp: , Rfl:  .  Multiple Vitamins-Minerals (MULTIVITAMIN WOMEN) TABS, Take by mouth daily., Disp: , Rfl:  .  pravastatin (PRAVACHOL) 20 MG tablet, Take 1 tablet (20 mg total) by mouth at bedtime., Disp:  90 tablet, Rfl: 3 .  telmisartan-hydrochlorothiazide (MICARDIS HCT) 80-25 MG tablet, Take 1 tablet by mouth daily. In am, Disp: 90 tablet, Rfl: 3  EXAM:  VITALS per patient if applicable:  GENERAL: alert, oriented, appears well and in no acute distress  PSYCH/NEURO: pleasant and cooperative, no obvious depression or anxiety, speech and thought processing grossly intact  ASSESSMENT AND PLAN:  Discussed the following assessment and plan:  Essential hypertension - Plan: telmisartan-hydrochlorothiazide (MICARDIS HCT) 80-25 MG tablet Given good Rx for HT  Prediabetes - Plan: check labs upcoming   Hyperlipidemia, unspecified hyperlipidemia type - Plan: cont pravachol 20 mg qhs tolerating   Thyroid nodule benign bx 02/21/28 [facility] rec f/u US in 6 months as of 12/2018 has been > 1 year  -will need f/u in future [employer] [facility]  Cataract of both eyes-referred [facility]    HM Declines flu shot  Disc other vaccines though pt doesn't really like vaccines (Tdap >10 years, shingrix, prevnar and pna 23) Prevgiven info about vaccinestoday Tdap, hep b vaccine -pt declined Hep B vaccine Hep c neg   Hascologaurdnot done yet -reordered test  Out of pap window s/p hysterectomy no h/o abnormal pap mammo 03/14/2019 negative DEXA 12/08/16 osteopenia rec Calcium 600 mg bid and vit D3 1000 IU qd. rec repeat in 3-5 years.  Former smoker in 39s x 2 years max 4-5  cig per day. H/o cataracts referred to [facility] Dr. Dorcas Mcmurrayingledein saw 2019 eyes ok -referred again today     I discussed the assessment and treatment plan with the patient. The patient was provided an opportunity to ask questions and all were answered. The patient agreed with the plan and demonstrated an understanding of the instructions.   The patient was advised to call back or seek an in-person evaluation if the symptoms worsen or if the condition fails to improve as anticipated.  Time spent 20 minutes  Bevelyn Bucklesracy N  McLean-Scocuzza, MD

## 2019-04-25 ENCOUNTER — Encounter (INDEPENDENT_AMBULATORY_CARE_PROVIDER_SITE_OTHER): Payer: Self-pay

## 2019-04-25 ENCOUNTER — Other Ambulatory Visit: Payer: Self-pay

## 2019-04-25 ENCOUNTER — Other Ambulatory Visit (INDEPENDENT_AMBULATORY_CARE_PROVIDER_SITE_OTHER): Payer: Medicare HMO

## 2019-04-25 DIAGNOSIS — I10 Essential (primary) hypertension: Secondary | ICD-10-CM

## 2019-04-25 DIAGNOSIS — R7303 Prediabetes: Secondary | ICD-10-CM

## 2019-04-25 DIAGNOSIS — Z1329 Encounter for screening for other suspected endocrine disorder: Secondary | ICD-10-CM

## 2019-04-25 DIAGNOSIS — E785 Hyperlipidemia, unspecified: Secondary | ICD-10-CM

## 2019-04-25 DIAGNOSIS — Z1389 Encounter for screening for other disorder: Secondary | ICD-10-CM | POA: Diagnosis not present

## 2019-04-25 LAB — COMPREHENSIVE METABOLIC PANEL
ALT: 14 U/L (ref 0–35)
AST: 14 U/L (ref 0–37)
Albumin: 4.2 g/dL (ref 3.5–5.2)
Alkaline Phosphatase: 73 U/L (ref 39–117)
BUN: 17 mg/dL (ref 6–23)
CO2: 27 mEq/L (ref 19–32)
Calcium: 9.5 mg/dL (ref 8.4–10.5)
Chloride: 105 mEq/L (ref 96–112)
Creatinine, Ser: 0.97 mg/dL (ref 0.40–1.20)
GFR: 68.26 mL/min (ref >60.00)
Glucose, Bld: 104 mg/dL — ABNORMAL HIGH (ref 70–99)
Potassium: 4.1 mEq/L (ref 3.5–5.1)
Sodium: 141 mEq/L (ref 135–145)
Total Bilirubin: 1 mg/dL (ref 0.2–1.2)
Total Protein: 6.5 g/dL (ref 6.0–8.3)

## 2019-04-25 LAB — LIPID PANEL
Cholesterol: 156 mg/dL (ref 0–200)
HDL: 52.6 mg/dL (ref >39.00)
LDL Cholesterol: 80 mg/dL (ref 0–99)
NonHDL: 103.63
Total CHOL/HDL Ratio: 3
Triglycerides: 120 mg/dL (ref 0.0–149.0)
VLDL: 24 mg/dL (ref 0.0–40.0)

## 2019-04-25 LAB — CBC WITH DIFFERENTIAL/PLATELET
Basophils Absolute: 0 10*3/uL (ref 0.0–0.1)
Basophils Relative: 0.5 % (ref 0.0–3.0)
Eosinophils Absolute: 0.1 10*3/uL (ref 0.0–0.7)
Eosinophils Relative: 2.6 % (ref 0.0–5.0)
HCT: 38.8 % (ref 36.0–46.0)
Hemoglobin: 12.6 g/dL (ref 12.0–15.0)
Lymphocytes Relative: 28.8 % (ref 12.0–46.0)
Lymphs Abs: 1.5 10*3/uL (ref 0.7–4.0)
MCHC: 32.4 g/dL (ref 30.0–36.0)
MCV: 88.9 fl (ref 78.0–100.0)
Monocytes Absolute: 0.5 10*3/uL (ref 0.1–1.0)
Monocytes Relative: 8.8 % (ref 3.0–12.0)
Neutro Abs: 3.2 10*3/uL (ref 1.4–7.7)
Neutrophils Relative %: 59.3 % (ref 43.0–77.0)
Platelets: 231 10*3/uL (ref 150.0–400.0)
RBC: 4.36 Mil/uL (ref 3.87–5.11)
RDW: 15 % (ref 11.5–15.5)
WBC: 5.3 10*3/uL (ref 4.0–10.5)

## 2019-04-25 LAB — TSH: TSH: 0.97 u[IU]/mL (ref 0.35–4.50)

## 2019-04-25 LAB — HEMOGLOBIN A1C: Hgb A1c MFr Bld: 6.4 % (ref 4.6–6.5)

## 2019-04-26 LAB — URINALYSIS, ROUTINE W REFLEX MICROSCOPIC
Bilirubin Urine: NEGATIVE
Glucose, UA: NEGATIVE
Hgb urine dipstick: NEGATIVE
Ketones, ur: NEGATIVE
Leukocytes,Ua: NEGATIVE
Nitrite: NEGATIVE
Protein, ur: NEGATIVE
Specific Gravity, Urine: 1.023 (ref 1.001–1.03)
pH: 6.5 (ref 5.0–8.0)

## 2019-04-30 DIAGNOSIS — H2513 Age-related nuclear cataract, bilateral: Secondary | ICD-10-CM | POA: Diagnosis not present

## 2019-05-02 ENCOUNTER — Encounter: Payer: Medicare HMO | Admitting: Internal Medicine

## 2019-05-03 ENCOUNTER — Other Ambulatory Visit: Payer: Self-pay

## 2019-05-04 ENCOUNTER — Encounter (INDEPENDENT_AMBULATORY_CARE_PROVIDER_SITE_OTHER): Payer: Self-pay

## 2019-05-04 ENCOUNTER — Ambulatory Visit (INDEPENDENT_AMBULATORY_CARE_PROVIDER_SITE_OTHER): Payer: Medicare HMO | Admitting: Internal Medicine

## 2019-05-04 ENCOUNTER — Encounter: Payer: Self-pay | Admitting: Internal Medicine

## 2019-05-04 VITALS — BP 132/78 | HR 73 | Temp 97.6°F | Ht 67.0 in | Wt 214.4 lb

## 2019-05-04 DIAGNOSIS — I10 Essential (primary) hypertension: Secondary | ICD-10-CM | POA: Diagnosis not present

## 2019-05-04 DIAGNOSIS — E785 Hyperlipidemia, unspecified: Secondary | ICD-10-CM | POA: Diagnosis not present

## 2019-05-04 DIAGNOSIS — R7303 Prediabetes: Secondary | ICD-10-CM | POA: Diagnosis not present

## 2019-05-04 NOTE — Patient Instructions (Addendum)
Consider echo/ultrasound of your heart  Consider compression stockings  Consider hydrocortisone 2.5 mg %  vasoline or polysporin or bacitracin   Lymphedema  Lymphedema is swelling that is caused by the abnormal collection of lymph in the tissues under the skin. Lymph is fluid from the tissues in your body that is removed through the lymphatic system. This system is part of your body's defense system (immune system) and includes lymph nodes and lymph vessels. The lymph vessels collect and carry the excess fluid, fats, proteins, and wastes from the tissues of the body to the bloodstream. This system also works to clean and remove bacteria and waste products from the body. Lymphedema occurs when the lymphatic system is blocked. When the lymph vessels or lymph nodes are blocked or damaged, lymph does not drain properly. This causes an abnormal buildup of lymph, which leads to swelling in the affected area. This may include the trunk area, or an arm or leg. Lymphedema cannot be cured by medicines, but various methods can be used to help reduce the swelling. There are two types of lymphedema: primary lymphedema and secondary lymphedema. What are the causes? The cause of this condition depends on the type of lymphedema that you have.  Primary lymphedema is caused by the absence of lymph vessels or having abnormal lymph vessels at birth.  Secondary lymphedema occurs when lymph vessels are blocked or damaged. Secondary lymphedema is more common. Common causes of lymph vessel blockage include: ? Skin infection, such as cellulitis. ? Infection by parasites (filariasis). ? Injury. ? Radiation therapy. ? Cancer. ? Formation of scar tissue. ? Surgery. What are the signs or symptoms? Symptoms of this condition include:  Swelling of the arm or leg.  A heavy or tight feeling in the arm or leg.  Swelling of the feet, toes, or fingers. Shoes or rings may fit more tightly than before.  Redness of the  skin over the affected area.  Limited movement of the affected limb.  Sensitivity to touch or discomfort in the affected limb. How is this diagnosed? This condition may be diagnosed based on:  Your symptoms and medical history.  A physical exam.  Bioimpedance spectroscopy. In this test, painless electrical currents are used to measure fluid levels in your body.  Imaging tests, such as: ? Lymphoscintigraphy. In this test, a low dose of a radioactive substance is injected to trace the flow of lymph through the lymph vessels. ? MRI. ? CT scan. ? Duplex ultrasound. This test uses sound waves to produce images of the vessels and the blood flow on a screen. ? Lymphangiography. In this test, a contrast dye is injected into the lymph vessel to help show blockages. How is this treated? Treatment for this condition may depend on the cause of your lymphedema. Treatment may include:  Complete decongestive therapy (CDT). This is done by a certified lymphedema therapist to reduce fluid congestion. This therapy includes: ? Manual lymph drainage. This is a special massage technique that promotes lymph drainage out of a limb. ? Skin care. ? Compression wrapping of the affected area. ? Specific exercises. Certain exercises can help fluid move out of the affected limb.  Compression. Various methods may be used to apply pressure to the affected limb to reduce the swelling. They include: ? Wearing compression stockings or sleeves on the affected limb. ? Wrapping the affected limb with special bandages.  Surgery. This is usually done for severe cases only. For example, surgery may be done if you have trouble  moving the limb or if the swelling does not get better with other treatments. If an underlying condition is causing the lymphedema, treatment for that condition will be done. For example, antibiotic medicines may be used to treat an infection. Follow these instructions at home: Self-care  The  affected area is more likely to become injured or infected. Take these steps to help prevent infection: ? Keep the affected area clean and dry. ? Use approved creams or lotions to keep the skin moisturized. ? Protect your skin from cuts:  Use gloves while cooking or gardening.  Do not walk barefoot.  If you shave the affected area, use an Neurosurgeonelectric razor.  Do not wear tight clothes, shoes, or jewelry.  Eat a healthy diet that includes a lot of fruits and vegetables. Activity  Exercise regularly as directed by your health care provider.  Do not sit with your legs crossed.  When possible, keep the affected limb raised (elevated) above the level of your heart.  Avoid carrying things with an arm that is affected by lymphedema. General instructions  Wear compression stockings or sleeves as told by your health care provider.  Note any changes in size of the affected limb. You may be instructed to take regular measurements and keep track of them.  Take over-the-counter and prescription medicines only as told by your health care provider.  If you were prescribed an antibiotic medicine, take or apply it as told by your health care provider. Do not stop using the antibiotic even if you start to feel better.  Do not use heating pads or ice packs over the affected area.  Avoid having blood draws, IV insertions, or blood pressure checked on the affected limb.  Keep all follow-up visits as told by your health care provider. This is important. Contact a health care provider if you:  Continue to have swelling in your limb.  Have a cut that does not heal.  Have redness or pain in the affected area. Get help right away if you:  Have new swelling in your limb that comes on suddenly.  Develop purplish spots, rash or sores (lesions) on your affected limb.  Have shortness of breath.  Have a fever or chills. Summary  Lymphedema is swelling that is caused by the abnormal collection of  lymph in the tissues under the skin.  Lymph is fluid from the tissues in your body that is removed through the lymphatic system. This system collects and carries excess fluid, fats, proteins, and wastes from the tissues of the body to the bloodstream.  Lymphedema causes swelling, pain, and redness in the affected area. This may include the trunk area, or an arm or leg.  Treatment for this condition may depend on the cause of your lymphedema. Treatment may include complete decongestive therapy (CDT), compression methods, surgery, or treating the underlying cause. This information is not intended to replace advice given to you by your health care provider. Make sure you discuss any questions you have with your health care provider. Document Released: 06/27/2007 Document Revised: 09/12/2017 Document Reviewed: 09/12/2017 Elsevier Patient Education  2020 [employer]

## 2019-05-04 NOTE — Progress Notes (Signed)
Chief Complaint  Patient presents with  . Annual Exam   F/u annual exam  1. HTN controlled on micardis hct 80-25  2. HLD improved on pravstatin 20 mg tolerating no side effects   Review of Systems  Constitutional: Negative for weight loss.  HENT: Positive for ear pain. Negative for hearing loss.   Eyes: Negative for blurred vision.  Respiratory: Negative for shortness of breath.   Cardiovascular: Negative for chest pain.  Gastrointestinal: Negative for abdominal pain.  Musculoskeletal: Negative for falls.  Skin: Negative for rash.  Neurological: Negative for headaches.  Psychiatric/Behavioral: Negative for depression and memory loss.   Past Medical History:  Diagnosis Date  . Blood transfusion without reported diagnosis   . Cataract    ? which eye wears glasses   . Hyperlipidemia   . Hypertension   . Prediabetes    Past Surgical History:  Procedure Laterality Date  . ABDOMINAL HYSTERECTOMY     over 30 years fibroids no h/o abnormal pap    Family History  Problem Relation Age of Onset  . Arthritis Mother   . Diabetes Mother   . Hypertension Mother   . Alcohol abuse Father   . Arthritis Father   . Dementia Father   . Stroke Sister   . Congestive Heart Failure Sister    Social History   Socioeconomic History  . Marital status: Single    Spouse name: Not on file  . Number of children: 0  . Years of education: Not on file  . Highest education level: Not on file  Occupational History  . Not on file  Social Needs  . Financial resource strain: Not hard at all  . Food insecurity    Worry: Never true    Inability: Never true  . Transportation needs    Medical: No    Non-medical: No  Tobacco Use  . Smoking status: Former Research scientist (life sciences)  . Smokeless tobacco: Never Used  . Tobacco comment: in 30s smoked x 2 years max 4-5 cig/day  Substance and Sexual Activity  . Alcohol use: No    Frequency: Never  . Drug use: No  . Sexual activity: Never  Lifestyle  . Physical  activity    Days per week: Not on file    Minutes per session: Not on file  . Stress: Not at all  Relationships  . Social Herbalist on phone: Not on file    Gets together: Not on file    Attends religious service: Not on file    Active member of club or organization: Not on file    Attends meetings of clubs or organizations: Not on file    Relationship status: Not on file  . Intimate partner violence    Fear of current or ex partner: Not on file    Emotionally abused: Not on file    Physically abused: Not on file    Forced sexual activity: Not on file  Other Topics Concern  . 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   Allergies  Allergen Reactions  . Lipitor [Atorvastatin Calcium]     feet, hands, ankles swelling per pt    .  Penicillins     Vaginal infections   Recent Results (from the past 2160 hour(s))  TSH     Status: None   Collection Time: 04/25/19 10:09 AM  Result Value Ref Range   TSH 0.97 0.35 - 4.50 uIU/mL  Hemoglobin A1c     Status: None   Collection Time: 04/25/19 10:09 AM  Result Value Ref Range   Hgb A1c MFr Bld 6.4 4.6 - 6.5 %    Comment: Glycemic Control Guidelines for People with Diabetes:Non Diabetic:  <6%Goal of Therapy: <7%Additional Action Suggested:  >8%   CBC with Differential/Platelet     Status: None   Collection Time: 04/25/19 10:09 AM  Result Value Ref Range   WBC 5.3 4.0 - 10.5 K/uL   RBC 4.36 3.87 - 5.11 Mil/uL   Hemoglobin 12.6 12.0 - 15.0 g/dL   HCT 16.138.8 09.636.0 - 04.546.0 %   MCV 88.9 78.0 - 100.0 fl   MCHC 32.4 30.0 - 36.0 g/dL   RDW 40.915.0 81.111.5 - 91.415.5 %   Platelets 231.0 150.0 - 400.0 K/uL   Neutrophils Relative % 59.3 43.0 - 77.0 %    Lymphocytes Relative 28.8 12.0 - 46.0 %   Monocytes Relative 8.8 3.0 - 12.0 %   Eosinophils Relative 2.6 0.0 - 5.0 %   Basophils Relative 0.5 0.0 - 3.0 %   Neutro Abs 3.2 1.4 - 7.7 K/uL   Lymphs Abs 1.5 0.7 - 4.0 K/uL   Monocytes Absolute 0.5 0.1 - 1.0 K/uL   Eosinophils Absolute 0.1 0.0 - 0.7 K/uL   Basophils Absolute 0.0 0.0 - 0.1 K/uL  Lipid panel     Status: None   Collection Time: 04/25/19 10:09 AM  Result Value Ref Range   Cholesterol 156 0 - 200 mg/dL    Comment: ATP III Classification       Desirable:  < 200 mg/dL               Borderline High:  200 - 239 mg/dL          High:  > = 782240 mg/dL   Triglycerides 956.2120.0 0.0 - 149.0 mg/dL    Comment: Normal:  <130<150 mg/dLBorderline High:  150 - 199 mg/dL   HDL 86.5752.60 >84.69>39.00 mg/dL   VLDL 62.924.0 0.0 - 52.840.0 mg/dL   LDL Cholesterol 80 0 - 99 mg/dL   Total CHOL/HDL Ratio 3     Comment:                Men          Women1/2 Average Risk     3.4          3.3Average Risk          5.0          4.42X Average Risk          9.6          7.13X Average Risk          15.0          11.0                       NonHDL 103.63     Comment: NOTE:  Non-HDL goal should be 30 mg/dL higher than patient's LDL goal (i.e. LDL goal of < 70 mg/dL, would have non-HDL goal of < 100 mg/dL)  Comprehensive metabolic panel     Status: Abnormal   Collection Time: 04/25/19 10:09 AM  Result Value  Ref Range   Sodium 141 135 - 145 mEq/L   Potassium 4.1 3.5 - 5.1 mEq/L   Chloride 105 96 - 112 mEq/L   CO2 27 19 - 32 mEq/L   Glucose, Bld 104 (H) 70 - 99 mg/dL   BUN 17 6 - 23 mg/dL   Creatinine, Ser 1.610.97 0.40 - 1.20 mg/dL   Total Bilirubin 1.0 0.2 - 1.2 mg/dL   Alkaline Phosphatase 73 39 - 117 U/L   AST 14 0 - 37 U/L   ALT 14 0 - 35 U/L   Total Protein 6.5 6.0 - 8.3 g/dL   Albumin 4.2 3.5 - 5.2 g/dL   Calcium 9.5 8.4 - 09.610.5 mg/dL   GFR 04.5468.26 >09.81>60.00 mL/min  Urinalysis, Routine w reflex microscopic     Status: None   Collection Time: 04/25/19 10:24 AM  Result Value Ref Range    Color, Urine YELLOW YELLOW   APPearance CLEAR CLEAR   Specific Gravity, Urine 1.023 1.001 - 1.03   pH 6.5 5.0 - 8.0   Glucose, UA NEGATIVE NEGATIVE   Bilirubin Urine NEGATIVE NEGATIVE   Ketones, ur NEGATIVE NEGATIVE   Hgb urine dipstick NEGATIVE NEGATIVE   Protein, ur NEGATIVE NEGATIVE   Nitrite NEGATIVE NEGATIVE   Leukocytes,Ua NEGATIVE NEGATIVE   Objective  Body mass index is 33.58 kg/m. Wt Readings from Last 3 Encounters:  05/04/19 214 lb 6.4 oz (97.3 kg)  06/23/18 205 lb 1.9 oz (93 kg)  05/09/18 204 lb 1.6 oz (92.6 kg)   Temp Readings from Last 3 Encounters:  05/04/19 97.6 F (36.4 C) (Oral)  03/23/19 98.2 F (36.8 C)  01/30/19 97.8 F (36.6 C)   BP Readings from Last 3 Encounters:  05/04/19 132/78  06/23/18 118/70  05/09/18 120/68   Pulse Readings from Last 3 Encounters:  05/04/19 73  06/23/18 69  05/09/18 66    Physical Exam Vitals signs and nursing note reviewed.  Constitutional:      Appearance: Normal appearance. She is well-developed and well-groomed. She is obese.  HENT:     Head: Normocephalic and atraumatic.     Comments: Mask on  Mild fluid R>L ear   Eyes:     Extraocular Movements: Extraocular movements intact.     Conjunctiva/sclera: Conjunctivae normal.     Pupils: Pupils are equal, round, and reactive to light.  Cardiovascular:     Rate and Rhythm: Normal rate and regular rhythm.     Heart sounds: No murmur.  Pulmonary:     Effort: Pulmonary effort is normal.     Breath sounds: Normal breath sounds.  Musculoskeletal:     Right lower leg: 1+ Pitting Edema present.     Left lower leg: 1+ Pitting Edema present.  Skin:    General: Skin is warm and dry.  Neurological:     General: No focal deficit present.     Mental Status: She is alert and oriented to person, place, and time. Mental status is at baseline.     Gait: Gait normal.  Psychiatric:        Attention and Perception: Attention and perception normal.        Mood and Affect:  Mood and affect normal.        Speech: Speech normal.        Behavior: Behavior normal. Behavior is cooperative.        Thought Content: Thought content normal.        Cognition and Memory: Cognition and memory normal.  Judgment: Judgment normal.     Assessment   1. HTN/HLD 2. Prediabetes A1C 6.4  3. HM Plan   1. Cont meds  Consider echo in future  2. Recheck A1C in 3 months disc healthy diet choices  3.  HM Declines flu shot and other vaccines (Tdap, prevnar, pna 23, Tdap, shingrix) Hep c neg   Hascologaurdnot done yet -reordered test has not done as of 05/04/19   Out of pap window s/p hysterectomy no h/o abnormal pap  mammo 03/14/2019 negativedeclines breast exam today   DEXA 12/08/16 osteopenia rec Calcium 600 mg bid and vit D3 1000 IU qd. rec repeat in 3-5 years.   Former smoker in 30s x 2 years max 4-5 cig per day. H/o cataracts referred to [facility] Dr. Georgia Domingledeinsaw 2019 eyes ok -referred again today     Provider: Dr. French Anaracy McLean-Scocuzza-Internal Medicine

## 2019-06-21 ENCOUNTER — Telehealth: Payer: Self-pay

## 2019-06-21 NOTE — Telephone Encounter (Signed)
Copied from [place] [id number]. Topic: Appointment Scheduling - Scheduling Inquiry for Clinic >> Jun 19, 2019  4:58 PM Erick Blinks wrote: Reason for CRM: Pt called to report that she has "no record" of [place], does not wish to cancel but would rather speak to office about why appt is necessary. Please advise

## 2019-06-21 NOTE — Telephone Encounter (Signed)
Not sure this is for me?   [place]

## 2019-06-25 NOTE — Telephone Encounter (Signed)
Rescheduled to 07/02/19

## 2019-06-26 ENCOUNTER — Ambulatory Visit: Payer: Medicare HMO | Admitting: Internal Medicine

## 2019-06-26 ENCOUNTER — Ambulatory Visit: Payer: Medicare HMO

## 2019-07-02 ENCOUNTER — Other Ambulatory Visit: Payer: Self-pay

## 2019-07-02 ENCOUNTER — Ambulatory Visit (INDEPENDENT_AMBULATORY_CARE_PROVIDER_SITE_OTHER): Payer: Medicare HMO

## 2019-07-02 DIAGNOSIS — Z0000 Encounter for general adult medical examination without abnormal findings: Secondary | ICD-10-CM | POA: Diagnosis not present

## 2019-07-02 NOTE — Patient Instructions (Addendum)
  Stacy Vasquez , Thank you for taking time to come for your Medicare Wellness Visit. I appreciate your ongoing commitment to your health goals. Please review the following plan we discussed and let me know if I can assist you in the future.   These are the goals we discussed: Goals    . Increase physical activity     Walk for exercise       This is a list of the screening recommended for you and due dates:  Health Maintenance  Topic Date Due  . Cologuard (Stool DNA test)  01/30/1997  . Flu Shot  12/12/2019*  . Tetanus Vaccine  05/03/2020*  . Pneumonia vaccines (1 of 2 - PCV13) 05/03/2020*  . Mammogram  03/13/2021  . DEXA scan (bone density measurement)  Completed  .  Hepatitis C: One time screening is recommended by Center for Disease Control  (CDC) for  adults born from [birth year] through 1964.   Completed  *Topic was postponed. The date shown is not the original due date.

## 2019-07-02 NOTE — Progress Notes (Signed)
Subjective:   Stacy Vasquez is a 73 y.o. female who presents for Medicare Annual (Subsequent) preventive examination.  Review of Systems:  No ROS.  Medicare Wellness Virtual Visit.  Visual/audio telehealth visit, UTA vital signs.   See social history for additional risk factors.   Cardiac Risk Factors include: advanced age (>27mn, >>5women);hypertension     Objective:     Vitals: There were no vitals taken for this visit.  There is no height or weight on file to calculate BMI.  Advanced Directives 07/02/2019 06/23/2018  Does Patient Have a Medical Advance Directive? No No  Does patient want to make changes to medical advance directive? - Yes (MAU/Ambulatory/Procedural Areas - Information given)  Would patient like information on creating a medical advance directive? No - Patient declined -    Tobacco Social History   Tobacco Use  Smoking Status Former Smoker  Smokeless Tobacco Never Used  Tobacco Comment   in 36ssmoked x 2 years max 4-5 cig/day     Counseling given: Not Answered Comment: in 89ssmoked x 2 years max 4-5 cig/day   Clinical Intake:  Pre-visit preparation completed: Yes        Diabetes: No  How often do you need to have someone help you when you read instructions, pamphlets, or other written materials from your doctor or pharmacy?: 1 - Never  Interpreter Needed?: No     Past Medical History:  Diagnosis Date  . Blood transfusion without reported diagnosis   . Cataract    ? which eye wears glasses   . Hyperlipidemia   . Hypertension   . Prediabetes    Past Surgical History:  Procedure Laterality Date  . ABDOMINAL HYSTERECTOMY     over 30 years fibroids no h/o abnormal pap    Family History  Problem Relation Age of Onset  . Arthritis Mother   . Diabetes Mother   . Hypertension Mother   . Alcohol abuse Father   . Arthritis Father   . Dementia Father   . Stroke Sister   . Congestive Heart Failure Sister    Social History    Socioeconomic History  . Marital status: Single    Spouse name: Not on file  . Number of children: 0  . Years of education: Not on file  . Highest education level: Not on file  Occupational History  . Not on file  Social Needs  . Financial resource strain: Not hard at all  . Food insecurity    Worry: Never true    Inability: Never true  . Transportation needs    Medical: No    Non-medical: No  Tobacco Use  . Smoking status: Former SResearch scientist (life sciences) . Smokeless tobacco: Never Used  . Tobacco comment: in 30s smoked x 2 years max 4-5 cig/day  Substance and Sexual Activity  . Alcohol use: No    Frequency: Never  . Drug use: No  . Sexual activity: Never  Lifestyle  . Physical activity    Days per week: Not on file    Minutes per session: Not on file  . Stress: Not at all  Relationships  . Social c[place]bal[place] phone: Not on file    Gets together: Not on file    Attends religious service: Not on file    Active member of club or organization: Not on file    Attends meetings of clubs or organizations: Not on file    Relationship status: Not on  file  Other Topics Concern  . 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 Cholecalciferol (VITAMIN D3) 1000 units CAPS Take by mouth.  . cyanocobalamin 1000 MCG tablet Take 1,000 mcg by mouth daily.  . Multiple Vitamins-Minerals (MULTIVITAMIN WOMEN) TABS Take by mouth daily.  . pravastatin (PRAVACHOL) 20 MG tablet Take 1 tablet (20 mg total) by mouth at bedtime.  Marland Kitchen telmisartan-hydrochlorothiazide (MICARDIS HCT) 80-25 MG tablet Take 1 tablet by mouth daily. In am   No facility-administered encounter medications on file as of 07/02/2019.     Activities of Daily Living In your present state of health, do  you have any difficulty performing the following activities: 07/02/2019  Hearing? N  Vision? N  Difficulty concentrating or making decisions? N  Walking or climbing stairs? N  Dressing or bathing? N  Doing errands, shopping? N  Preparing Food and eating ? N  Using the Toilet? N  In the past six months, have you accidently leaked urine? N  Do you have problems with loss of bowel control? N  Managing your Medications? N  Managing your Finances? N  Housekeeping or managing your Housekeeping? N  Some recent data might be hidden    Patient Care Team: McLean-Scocuzza, Nino Glow, MD as PCP - General (Internal Medicine)    Assessment:   This is a routine wellness examination for Stacy Vasquez.  I connected with patient 07/02/19 at 12:00 PM EDT by an audio enabled telemedicine application and verified that I am speaking with the correct person using two identifiers. Patient stated full name and DOB. Patient gave permission to continue with virtual visit. Patient's location was at home and Nurse's location was at [place] office.   Health Maintenance Due: -Influenza vaccine 2020- discussed; to be completed in season with doctor or local pharmacy.   -Cologuard- she has received the kit to to her home; plans to complete Update all pending maintenance due as appropriate.   See completed HM at the end of note.   Eye: Visual acuity not assessed. Virtual visit. Cataracts extracted. Followed by their ophthalmologist every 12 months.   Dental: She plans to schedule.  Hearing: Demonstrates normal hearing during visit.  Safety:  Patient feels safe at home- yes Patient does have smoke detectors at home- yes Patient does wear sunscreen or protective clothing when in direct sunlight - yes Patient does wear seat belt when in a moving vehicle - yes Patient drives- yes Adequate lighting in walkways free from debris- yes Grab bars and handrails used as appropriate- yes Ambulates with no assistive device  Cell phone on person when ambulating outside of the home- yes  Social: Alcohol intake - no      Smoking history- former    Smokers in home? none Illicit drug use? none  Depression: PHQ 2 &9 complete. See screening below. Denies irritability, anhedonia, sadness/tearfullness.    Falls: See screening below.    Medication: Taking as directed and without issues.   Covid-19: Precautions and sickness symptoms discussed. Wears mask, social distancing, hand hygiene as appropriate.   Activities of Daily Living Patient denies needing assistance with: household chores, feeding themselves, getting from bed to chair, getting to the toilet, bathing/showering, dressing, managing money, or preparing  meals.   Memory: Patient is alert. Patient denies difficulty focusing or concentrating. Correctly identified the president of the [place], season and recall. Patient likes to read, play sodoku, completes crafts for brain stimulation.  BMI- discussed the importance of a healthy diet, water intake and the benefits of aerobic exercise.  Educational material provided.  Physical activity- yard work  Diet: low carb Water: good intake Caffeine: 1 cup of coffee  Other Providers Patient Care Team: McLean-Scocuzza, Nino Glow, MD as PCP - General (Internal Medicine)  Exercise Activities and Dietary recommendations Current Exercise Habits: Home exercise routine  Goals    . Increase physical activity     Walk for exercise       Fall Risk Fall Risk  07/02/2019 05/04/2019 03/28/2019 05/09/2018 12/29/2017  Falls in the past year? 0 0 0 No No  Timed Get Up and Go performed: no, virtual visit  Depression Screen PHQ 2/9 Scores 07/02/2019 05/04/2019 03/28/2019 05/09/2018  PHQ - 2 Score 0 0 0 0     Cognitive Function     6CIT Screen 07/02/2019 06/23/2018  What Year? 0 points 0 points  What month? 0 points 0 points  What time? 0 points 0 points  Count back from 20 0 points 0 points  Months in reverse 0  points 0 points  Repeat phrase 0 points 0 points  Total Score 0 0     There is no immunization history on file for this patient.  Screening Tests Health Maintenance  Topic Date Due  . Fecal DNA (Cologuard)  01/30/1997  . INFLUENZA VACCINE  12/12/2019 (Originally 04/14/2019)  . TETANUS/TDAP  05/03/2020 (Originally 01/30/1966)  . PNA vac Low Risk Adult (1 of 2 - PCV13) 05/03/2020 (Originally 01/31/2012)  . MAMMOGRAM  03/13/2021  . DEXA SCAN  Completed  . Hepatitis C Screening  Completed      Plan:   Keep all routine maintenance appointments.   Next scheduled lab 08/06/19 @ 10:00  Follow up 11/08/19 @ 1:30  Medicare Attestation I have personally reviewed: The patient's medical and social history Their use of alcohol, tobacco or illicit drugs Their current medications and supplements The patient's functional ability including ADLs,fall risks, home safety risks, cognitive, and hearing and visual impairment Diet and physical activities Evidence for depression   In addition, I have reviewed and discussed with patient certain preventive protocols, quality metrics, and best practice recommendations. A written personalized care plan for preventive services as well as general preventive health recommendations were provided to patient via mail.     Varney Biles, LPN  [id number]

## 2019-07-31 ENCOUNTER — Telehealth: Payer: Self-pay

## 2019-07-31 NOTE — Telephone Encounter (Signed)
Please place future orders for patient's upcoming appointment. Thanks 

## 2019-08-01 ENCOUNTER — Other Ambulatory Visit: Payer: Self-pay | Admitting: Internal Medicine

## 2019-08-01 DIAGNOSIS — I10 Essential (primary) hypertension: Secondary | ICD-10-CM

## 2019-08-01 DIAGNOSIS — E785 Hyperlipidemia, unspecified: Secondary | ICD-10-CM

## 2019-08-01 DIAGNOSIS — R7303 Prediabetes: Secondary | ICD-10-CM

## 2019-08-06 ENCOUNTER — Other Ambulatory Visit: Payer: Self-pay

## 2019-08-06 ENCOUNTER — Other Ambulatory Visit (INDEPENDENT_AMBULATORY_CARE_PROVIDER_SITE_OTHER): Payer: Medicare HMO

## 2019-08-06 DIAGNOSIS — I10 Essential (primary) hypertension: Secondary | ICD-10-CM | POA: Diagnosis not present

## 2019-08-06 DIAGNOSIS — R7303 Prediabetes: Secondary | ICD-10-CM

## 2019-08-06 DIAGNOSIS — E785 Hyperlipidemia, unspecified: Secondary | ICD-10-CM | POA: Diagnosis not present

## 2019-08-06 LAB — COMPREHENSIVE METABOLIC PANEL
ALT: 12 U/L (ref 0–35)
AST: 13 U/L (ref 0–37)
Albumin: 3.8 g/dL (ref 3.5–5.2)
Alkaline Phosphatase: 72 U/L (ref 39–117)
BUN: 16 mg/dL (ref 6–23)
CO2: 28 mEq/L (ref 19–32)
Calcium: 9.1 mg/dL (ref 8.4–10.5)
Chloride: 106 mEq/L (ref 96–112)
Creatinine, Ser: 0.88 mg/dL (ref 0.40–1.20)
GFR: 76.32 mL/min (ref >60.00)
Glucose, Bld: 101 mg/dL — ABNORMAL HIGH (ref 70–99)
Potassium: 4 mEq/L (ref 3.5–5.1)
Sodium: 141 mEq/L (ref 135–145)
Total Bilirubin: 0.9 mg/dL (ref 0.2–1.2)
Total Protein: 6.3 g/dL (ref 6.0–8.3)

## 2019-08-06 LAB — CBC WITH DIFFERENTIAL/PLATELET
Basophils Absolute: 0 10*3/uL (ref 0.0–0.1)
Basophils Relative: 0.5 % (ref 0.0–3.0)
Eosinophils Absolute: 0.1 10*3/uL (ref 0.0–0.7)
Eosinophils Relative: 3.4 % (ref 0.0–5.0)
HCT: 39.3 % (ref 36.0–46.0)
Hemoglobin: 12.7 g/dL (ref 12.0–15.0)
Lymphocytes Relative: 36.1 % (ref 12.0–46.0)
Lymphs Abs: 1.3 10*3/uL (ref 0.7–4.0)
MCHC: 32.3 g/dL (ref 30.0–36.0)
MCV: 88.3 fl (ref 78.0–100.0)
Monocytes Absolute: 0.4 10*3/uL (ref 0.1–1.0)
Monocytes Relative: 9.5 % (ref 3.0–12.0)
Neutro Abs: 1.9 10*3/uL (ref 1.4–7.7)
Neutrophils Relative %: 50.5 % (ref 43.0–77.0)
Platelets: 211 10*3/uL (ref 150.0–400.0)
RBC: 4.45 Mil/uL (ref 3.87–5.11)
RDW: 14 % (ref 11.5–15.5)
WBC: 3.7 10*3/uL — ABNORMAL LOW (ref 4.0–10.5)

## 2019-08-06 LAB — LIPID PANEL
Cholesterol: 146 mg/dL (ref 0–200)
HDL: 49.6 mg/dL (ref >39.00)
LDL Cholesterol: 80 mg/dL (ref 0–99)
NonHDL: 96.7
Total CHOL/HDL Ratio: 3
Triglycerides: 85 mg/dL (ref 0.0–149.0)
VLDL: 17 mg/dL (ref 0.0–40.0)

## 2019-08-06 LAB — HEMOGLOBIN A1C: Hgb A1c MFr Bld: 6.2 % (ref 4.6–6.5)

## 2019-08-14 ENCOUNTER — Encounter: Payer: Self-pay | Admitting: Internal Medicine

## 2019-08-14 ENCOUNTER — Other Ambulatory Visit: Payer: Self-pay

## 2019-08-14 ENCOUNTER — Ambulatory Visit (INDEPENDENT_AMBULATORY_CARE_PROVIDER_SITE_OTHER): Payer: Medicare HMO | Admitting: Internal Medicine

## 2019-08-14 VITALS — Ht 67.0 in | Wt 214.4 lb

## 2019-08-14 DIAGNOSIS — G8929 Other chronic pain: Secondary | ICD-10-CM | POA: Insufficient documentation

## 2019-08-14 DIAGNOSIS — D72819 Decreased white blood cell count, unspecified: Secondary | ICD-10-CM | POA: Diagnosis not present

## 2019-08-14 DIAGNOSIS — M25561 Pain in right knee: Secondary | ICD-10-CM | POA: Diagnosis not present

## 2019-08-14 HISTORY — DX: Other chronic pain: G89.29

## 2019-08-14 NOTE — Progress Notes (Signed)
Pt says that right knee pain has been ongoing for a while.

## 2019-08-14 NOTE — Progress Notes (Signed)
Telephone Note  I connected with Stacy Vasquez   on 08/14/19 at  1:15 PM EST by a telephone and verified that I am speaking with the correct person using two identifiers.  Location patient: home Location provider:work or home office Persons participating in the virtual visit: patient, provider  I discussed the limitations of evaluation and management by telemedicine and the availability of in person appointments. The patient expressed understanding and agreed to proceed.   HPI: 1. Right knee pain chronic 10/10 trauma as kid knee hurts on the left side pain worse with turning motions and stiff in the am and right knee more swollen than left knee Aleve helps in winder pain is worse  2. Reviewed labs leukopenia no sick sx's feeling well will repeat in 4-6 weeks    ROS: See pertinent positives and negatives per HPI.  Past Medical History:  Diagnosis Date  . Blood transfusion without reported diagnosis   . Cataract    ? which eye wears glasses   . Hyperlipidemia   . Hypertension   . Prediabetes     Past Surgical History:  Procedure Laterality Date  . ABDOMINAL HYSTERECTOMY     over 30 years fibroids no h/o abnormal pap     Family History  Problem Relation Age of Onset  . Arthritis Mother   . Diabetes Mother   . Hypertension Mother   . Alcohol abuse Father   . Arthritis Father   . Dementia Father   . Stroke Sister   . 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  Cholecalciferol (VITAMIN D3) 1000 units CAPS, Take by mouth., Disp: , Rfl:  .  cyanocobalamin 1000 MCG tablet, Take 1,000 mcg by mouth daily., Disp: , Rfl:  .  Multiple Vitamins-Minerals (MULTIVITAMIN WOMEN) TABS, Take by mouth daily., Disp: , Rfl:  .  pravastatin (PRAVACHOL) 20 MG tablet, Take 1 tablet (20 mg total) by  mouth at bedtime., Disp: 90 tablet, Rfl: 3 .  telmisartan-hydrochlorothiazide (MICARDIS HCT) 80-25 MG tablet, Take 1 tablet by mouth daily. In am, Disp: 90 tablet, Rfl: 3  EXAM:  VITALS per patient if applicable:  GENERAL: alert, oriented, appears well and in no acute distress  PSYCH/NEURO: pleasant and cooperative, no obvious depression or anxiety, speech and thought processing grossly intact  ASSESSMENT AND PLAN:  Discussed the following assessment and plan:  Chronic pain of right knee - Plan: DG Knee Complete 4 Views Right Consider ortho kc Dr. Ernest Pine referral in future  Prn tylenol rec use aleve sparingly   Leukopenia, unspecified type - Plan: CBC with Differential/Platelet  HM Declines flu shot and other vaccines (Tdap, prevnar, pna 23, Tdap, shingrix) Hep c neg  Hascologaurdnot done yet -reordered testhas not done as of 08/14/19   Out of pap window s/p hysterectomy no h/o abnormal pap  mammo7/1/2020negativedeclines breast exam prev   DEXA 12/08/16 osteopenia rec Calcium 600 mg bid and vit D3 1000 IU qd. rec repeat in 3-5 years.   Former smoker in 30s x 2 years max 4-5 cig per day.  H/o cataracts stable referred to [facility] Dr. Georgia Dom 05/2019 eyes ok   -we discussed possible serious and likely etiologies, options for evaluation and workup, limitations of telemedicine visit vs in person visit, treatment, treatment risks and  precautions. Pt prefers to treat via telemedicine empirically rather then risking or undertaking an in person visit at this moment. Patient agrees to seek prompt in person care if worsening, new symptoms arise, or if is not improving with treatment.   I discussed the assessment and treatment plan with the patient. The patient was provided an opportunity to ask questions and all were answered. The patient agreed with the plan and demonstrated an understanding of the instructions.   The patient was advised to call back or  seek an in-person evaluation if the symptoms worsen or if the condition fails to improve as anticipated.  Time spent 15 minutes  Delorise Jackson, MD

## 2019-08-15 ENCOUNTER — Telehealth: Payer: Self-pay | Admitting: Internal Medicine

## 2019-08-15 NOTE — Telephone Encounter (Signed)
I called pt regarding appt to schedule Return for f/u in 3-4 months. Not able to get through.

## 2019-09-12 ENCOUNTER — Other Ambulatory Visit: Payer: Self-pay

## 2019-09-12 ENCOUNTER — Other Ambulatory Visit (INDEPENDENT_AMBULATORY_CARE_PROVIDER_SITE_OTHER): Payer: Medicare HMO

## 2019-09-12 ENCOUNTER — Other Ambulatory Visit: Payer: Medicare HMO

## 2019-09-12 DIAGNOSIS — D72819 Decreased white blood cell count, unspecified: Secondary | ICD-10-CM | POA: Diagnosis not present

## 2019-09-12 LAB — CBC WITH DIFFERENTIAL/PLATELET
Basophils Absolute: 0 10*3/uL (ref 0.0–0.1)
Basophils Relative: 0.6 % (ref 0.0–3.0)
Eosinophils Absolute: 0.1 10*3/uL (ref 0.0–0.7)
Eosinophils Relative: 2.7 % (ref 0.0–5.0)
HCT: 39 % (ref 36.0–46.0)
Hemoglobin: 12.7 g/dL (ref 12.0–15.0)
Lymphocytes Relative: 34.8 % (ref 12.0–46.0)
Lymphs Abs: 1.3 10*3/uL (ref 0.7–4.0)
MCHC: 32.5 g/dL (ref 30.0–36.0)
MCV: 88.6 fl (ref 78.0–100.0)
Monocytes Absolute: 0.4 10*3/uL (ref 0.1–1.0)
Monocytes Relative: 9.9 % (ref 3.0–12.0)
Neutro Abs: 2 10*3/uL (ref 1.4–7.7)
Neutrophils Relative %: 52 % (ref 43.0–77.0)
Platelets: 202 10*3/uL (ref 150.0–400.0)
RBC: 4.41 Mil/uL (ref 3.87–5.11)
RDW: 14.5 % (ref 11.5–15.5)
WBC: 3.7 10*3/uL — ABNORMAL LOW (ref 4.0–10.5)

## 2019-09-13 ENCOUNTER — Other Ambulatory Visit: Payer: Medicare HMO

## 2019-11-08 ENCOUNTER — Ambulatory Visit: Payer: Medicare HMO | Admitting: Internal Medicine

## 2019-12-13 ENCOUNTER — Other Ambulatory Visit: Payer: Self-pay

## 2019-12-18 ENCOUNTER — Encounter: Payer: Self-pay | Admitting: Internal Medicine

## 2019-12-18 ENCOUNTER — Other Ambulatory Visit: Payer: Self-pay

## 2019-12-18 ENCOUNTER — Ambulatory Visit (INDEPENDENT_AMBULATORY_CARE_PROVIDER_SITE_OTHER): Payer: Medicare HMO | Admitting: Internal Medicine

## 2019-12-18 VITALS — BP 132/86 | HR 85 | Temp 97.7°F | Ht 67.0 in | Wt 211.4 lb

## 2019-12-18 DIAGNOSIS — Z1231 Encounter for screening mammogram for malignant neoplasm of breast: Secondary | ICD-10-CM | POA: Diagnosis not present

## 2019-12-18 DIAGNOSIS — E785 Hyperlipidemia, unspecified: Secondary | ICD-10-CM | POA: Diagnosis not present

## 2019-12-18 DIAGNOSIS — R7303 Prediabetes: Secondary | ICD-10-CM | POA: Diagnosis not present

## 2019-12-18 DIAGNOSIS — E2839 Other primary ovarian failure: Secondary | ICD-10-CM | POA: Diagnosis not present

## 2019-12-18 DIAGNOSIS — M8580 Other specified disorders of bone density and structure, unspecified site: Secondary | ICD-10-CM

## 2019-12-18 DIAGNOSIS — I10 Essential (primary) hypertension: Secondary | ICD-10-CM | POA: Diagnosis not present

## 2019-12-18 MED ORDER — PRAVASTATIN SODIUM 20 MG PO TABS: 20.0000 mg | ORAL_TABLET | Freq: Every day | ORAL | 3 refills | Status: DC

## 2019-12-18 MED ORDER — TELMISARTAN-HCTZ 80-25 MG PO TABS: 1.0000 | ORAL_TABLET | Freq: Every day | ORAL | 3 refills | Status: DC

## 2019-12-18 NOTE — Progress Notes (Signed)
Chief Complaint  Patient presents with  . Follow-up   F/U  1. htn SL Elevated today on micardis hctz 80-25 mg qd  2. Prediabetes eats sweet cakes and creamer will try to cut back  3. Leukopenia will f/u upcoming labs   Review of Systems  Constitutional: Positive for weight loss.  HENT: Negative for hearing loss.   Eyes: Negative for blurred vision.  Respiratory: Negative for shortness of breath.   Cardiovascular: Negative for chest pain.  Gastrointestinal: Negative for abdominal pain.  Musculoskeletal: Negative for falls.  Skin: Negative for rash.  Neurological: Negative for headaches.  Psychiatric/Behavioral: Negative for depression and memory loss.   Past Medical History:  Diagnosis Date  . Blood transfusion without reported diagnosis   . Cataract    ? which eye wears glasses   . Hyperlipidemia   . Hypertension   . Prediabetes    Past Surgical History:  Procedure Laterality Date  . ABDOMINAL HYSTERECTOMY     over 30 years fibroids no h/o abnormal pap    Family History  Problem Relation Age of Onset  . Arthritis Mother   . Diabetes Mother   . Hypertension Mother   . Alcohol abuse Father   . Arthritis Father   . Dementia Father   . Stroke Sister   . Congestive Heart Failure Sister   . Other Sister        09/13/19 covid 19+   Social History   Socioeconomic History  . Marital status: Single    Spouse name: Not on file  . Number of children: 0  . Years of education: Not on file  . Highest education level: Not on file  Occupational History  . Not on file  Tobacco Use  . Smoking status: Former Research scientist (life sciences)  . Smokeless tobacco: Never Used  . Tobacco comment: in 30s smoked x 2 years max 4-5 cig/day  Substance and Sexual Activity  . Alcohol use: No  . Drug use: No  . Sexual activity: Never  Other Topics Concern  . 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 Difficulty of Paying Living Expenses:   Food Insecurity:   . Worried About Charity fundraiser in the Last Year:   . Arboriculturist in the Last Year:   Transportation Needs:   . Film/video editor (Medical):   Stacy Vasquez Lack of Transportation (Non-Medical):   Physical Activity:   . Days of Exercise per Week:   . Minutes of Exercise per Session:   Stress:   . Feeling of Stress :   Social Connections:   . Frequency of Communication with Friends and Family:   . Frequency of Social Gatherings with Friends and Family:   . Attends Religious Services:   . Active Member of Clubs or Organizations:   . Attends Archivist Meetings:   Stacy Vasquez Marital Status:   Intimate Partner Violence:   . Fear of Current or Ex-Partner:   . Emotionally Abused:   Stacy Vasquez Physically Abused:   . Sexually Abused:    Current Meds  Medication Sig  . Cholecalciferol (VITAMIN D3) 1000 units CAPS Take by mouth.  . cyanocobalamin 1000 MCG tablet Take 1,000 mcg by mouth daily.  Stacy Vasquez  pravastatin (PRAVACHOL) 20 MG tablet Take 1 tablet (20 mg total) by mouth at bedtime.  Stacy Vasquez telmisartan-hydrochlorothiazide (MICARDIS HCT) 80-25 MG tablet Take 1 tablet by mouth daily. In am  . Turmeric (QC TUMERIC COMPLEX PO) Take by mouth.  . [DISCONTINUED] pravastatin (PRAVACHOL) 20 MG tablet Take 1 tablet (20 mg total) by mouth at bedtime.  . [DISCONTINUED] telmisartan-hydrochlorothiazide (MICARDIS HCT) 80-25 MG tablet Take 1 tablet by mouth daily. In am   Allergies  Allergen Reactions  . Lipitor [Atorvastatin Calcium]     feet, hands, ankles swelling per pt    . Penicillins     Vaginal infections   No results found for this or any previous visit (from the past 2160 hour(s)). Objective  Body mass index is 33.11 kg/m. Wt Readings from Last 3 Encounters:  12/18/19 211 lb 6.4 oz (95.9 kg)  08/14/19 214 lb 6.4 oz  (97.3 kg)  05/04/19 214 lb 6.4 oz (97.3 kg)   Temp Readings from Last 3 Encounters:  12/18/19 97.7 F (36.5 C) (Temporal)  05/04/19 97.6 F (36.4 C) (Oral)  03/23/19 98.2 F (36.8 C)   BP Readings from Last 3 Encounters:  12/18/19 132/86  05/04/19 132/78  06/23/18 118/70   Pulse Readings from Last 3 Encounters:  12/18/19 85  05/04/19 73  06/23/18 69    Physical Exam Vitals and nursing note reviewed.  Constitutional:      Appearance: Normal appearance. She is well-developed and well-groomed.  HENT:     Head: Normocephalic and atraumatic.  Eyes:     Conjunctiva/sclera: Conjunctivae normal.     Pupils: Pupils are equal, round, and reactive to light.  Cardiovascular:     Rate and Rhythm: Normal rate and regular rhythm.     Heart sounds: Normal heart sounds. No murmur.  Pulmonary:     Effort: Pulmonary effort is normal.     Breath sounds: Normal breath sounds.  Skin:    General: Skin is warm and dry.  Neurological:     General: No focal deficit present.     Mental Status: She is alert and oriented to person, place, and time. Mental status is at baseline.     Gait: Gait normal.  Psychiatric:        Attention and Perception: Attention and perception normal.        Mood and Affect: Mood and affect normal.        Speech: Speech normal.        Behavior: Behavior normal. Behavior is cooperative.        Thought Content: Thought content normal.        Cognition and Memory: Cognition normal.        Judgment: Judgment normal.     Assessment  Plan  Essential hypertension - Plan: telmisartan-hydrochlorothiazide (MICARDIS HCT) 80-25 MG tablet, Hemoglobin A1c, Comprehensive metabolic panel, Lipid panel, CBC with Differential/Platelet Goal <130/<80  Consider add norvasc 2.5 mg at f/u if still elevated   Hyperlipidemia, unspecified hyperlipidemia type - Plan: pravastatin (PRAVACHOL) 20 MG tablet, Comprehensive metabolic panel, Lipid panel  Prediabetes - Plan: Hemoglobin  A1c Healthy diet and exercise    HM Declines flu shotand other vaccines (Tdap, prevnar, pna 23, Tdap, shingrix) Hep c neg 2/2 covid 19 vaccines  2nd 11/16/19 covid 19 vx   Hascologaurdnot done yet -reordered testhas not done as of 08/14/19 exp 03/2020  Out of pap window s/p hysterectomy no h/o abnormal pap  mammo7/1/2020negativedeclines breast exam prev   DEXA 12/08/16 osteopenia rec Calcium 600  mg bid and vit D3 1000 IU qd. rec repeat in 3-5 years.   Former smoker in 30s x 2 years max 4-5 cig per day.  H/o cataracts stable referred to [facility] eye Dr. Georgia Dom 05/2019 eyes ok  Provider: Dr. French Ana McLean-Scocuzza-Internal Medicine

## 2019-12-18 NOTE — Patient Instructions (Addendum)
<130/<80   -consider norvasc 2.5 mg daily for more blood pressure control let me know  Beet supplements can lower blood pressure Hisbiscus herbal tea Grapes in moderation   Nasal saline, flonase, otc allergy pill claritin/zyrtec/xyzal/allegra at night    Call [facility] for mammogram and bone density 03/13/20   Please do cologuard on a Monday!!! Amlodipine Oral Tablets What is this medicine? AMLODIPINE (am LOE di peen) is a calcium channel blocker. It relaxes your blood vessels and decreases the amount of work the heart has to do. It treats high blood pressure and/or prevents chest pain (also called angina). This medicine may be used for other purposes; ask your health care provider or pharmacist if you have questions. COMMON BRAND NAME(S): Norvasc What should I tell my health care provider before I take this medicine? They need to know if you have any of these conditions:  heart disease  liver disease  an unusual or allergic reaction to amlodipine, other drugs, foods, dyes, or preservatives  pregnant or trying to get pregnant  breast-feeding How should I use this medicine? Take this drug by mouth. Take it as directed on the prescription label at the same time every day. You can take it with or without food. If it upsets your stomach, take it with food. Keep taking it unless your health care provider tells you to stop. Talk to your health care provider about the use of this drug in children. While it may be prescribed for children as young as 6 for selected conditions, precautions do apply. Overdosage: If you think you have taken too much of this medicine contact a poison control center or emergency room at once. NOTE: This medicine is only for you. Do not share this medicine with others. What if I miss a dose? If you miss a dose, take it as soon as you can. If it is almost time for your next dose, take only that dose. Do not take double or extra doses. What may interact with this  medicine? This medicine may interact with the following medications:  clarithromycin  cyclosporine  diltiazem  itraconazole  simvastatin  tacrolimus This list may not describe all possible interactions. Give your health care provider a list of all the medicines, herbs, non-prescription drugs, or dietary supplements you use. Also tell them if you smoke, drink alcohol, or use illegal drugs. Some items may interact with your medicine. What should I watch for while using this medicine? Visit your health care provider for regular checks on your progress. Check your blood pressure as directed. Ask your health care provider what your blood pressure should be. Also, find out when you should contact him or her. Do not treat yourself for coughs, colds, or pain while you are using this drug without asking your health care provider for advice. Some drugs may increase your blood pressure. You may get drowsy or dizzy. Do not drive, use machinery, or do anything that needs mental alertness until you know how this drug affects you. Do not stand up or sit up quickly, especially if you are an older patient. This reduces the risk of dizzy or fainting spells. What side effects may I notice from receiving this medicine? Side effects that you should report to your doctor or health care provider as soon as possible:  allergic reactions (skin rash, itching or hives; swelling of the face, lips, or tongue)  heart attack (trouble breathing; pain or tightness in the chest, neck, back or arms; unusually weak or tired)  low blood pressure (dizziness; feeling faint or lightheaded, falls; unusually weak or tired) Side effects that usually do not require medical attention (report these to your doctor or health care provider if they continue or are bothersome):  facial flushing  nausea  palpitations  stomach pain  sudden weight gain  swelling of the ankles, feet, hands This list may not describe all possible  side effects. Call your doctor for medical advice about side effects. You may report side effects to FDA at 1-800-FDA-1088. Where should I keep my medicine? Keep out of the reach of children and pets. Store at room temperature between 59 and 86 degrees F (15 and 30 degrees C). Protect from light and moisture. Keep the container tightly closed. Throw away any unused drug after the expiration date. NOTE: This sheet is a summary. It may not cover all possible information. If you have questions about this medicine, talk to your doctor, pharmacist, or health care provider.  2020 Elsevier/Gold Standard (2019-06-05 19:39:45)     Prediabetes Eating Plan Prediabetes is a condition that causes blood sugar (glucose) levels to be higher than normal. This increases the risk for developing diabetes. In order to prevent diabetes from developing, your health care provider may recommend a diet and other lifestyle changes to help you:  Control your blood glucose levels.  Improve your cholesterol levels.  Manage your blood pressure. Your health care provider may recommend working with a diet and nutrition specialist (dietitian) to make a meal plan that is best for you. What are tips for following this plan? Lifestyle  Set weight loss goals with the help of your health care team. It is recommended that most people with prediabetes lose 7% of their current body weight.  Exercise for at least 30 minutes at least 5 days a week.  Attend a support group or seek ongoing support from a mental health counselor.  Take over-the-counter and prescription medicines only as told by your health care provider. Reading food labels  Read food labels to check the amount of fat, salt (sodium), and sugar in prepackaged foods. Avoid foods that have: ? Saturated fats. ? Trans fats. ? Added sugars.  Avoid foods that have more than 300 milligrams (mg) of sodium per serving. Limit your daily sodium intake to less than 2,300  mg each day. Shopping  Avoid buying pre-made and processed foods. Cooking  Cook with olive oil. Do not use butter, lard, or ghee.  Bake, broil, grill, or boil foods. Avoid frying. Meal planning   Work with your dietitian to develop an eating plan that is right for you. This may include: ? Tracking how many calories you take in. Use a food diary, notebook, or mobile application to track what you eat at each meal. ? Using the glycemic index (GI) to plan your meals. The index tells you how quickly a food will raise your blood glucose. Choose low-GI foods. These foods take a longer time to raise blood glucose.  Consider following a Mediterranean diet. This diet includes: ? Several servings each day of fresh fruits and vegetables. ? Eating fish at least twice a week. ? Several servings each day of whole grains, beans, nuts, and seeds. ? Using olive oil instead of other fats. ? Moderate alcohol consumption. ? Eating small amounts of red meat and whole-fat dairy.  If you have high blood pressure, you may need to limit your sodium intake or follow a diet such as the DASH eating plan. DASH is an eating plan  that aims to lower high blood pressure. What foods are recommended? The items listed below may not be a complete list. Talk with your dietitian about what dietary choices are best for you. Grains Whole grains, such as whole-wheat or whole-grain breads, crackers, cereals, and pasta. Unsweetened oatmeal. Bulgur. Barley. Quinoa. Brown rice. Corn or whole-wheat flour tortillas or taco shells. Vegetables Lettuce. Spinach. Peas. Beets. Cauliflower. Cabbage. Broccoli. Carrots. Tomatoes. Squash. Eggplant. Herbs. Peppers. Onions. Cucumbers. Brussels sprouts. Fruits Berries. Bananas. Apples. Oranges. Grapes. Papaya. Mango. Pomegranate. Kiwi. Grapefruit. Cherries. Meats and other protein foods Seafood. Poultry without skin. Lean cuts of pork and beef. Tofu. Eggs. Nuts. Beans. Dairy Low-fat or  fat-free dairy products, such as yogurt, cottage cheese, and cheese. Beverages Water. Tea. Coffee. Sugar-free or diet soda. Seltzer water. Lowfat or no-fat milk. Milk alternatives, such as soy or almond milk. Fats and oils Olive oil. Canola oil. Sunflower oil. Grapeseed oil. Avocado. Walnuts. Sweets and desserts Sugar-free or low-fat pudding. Sugar-free or low-fat ice cream and other frozen treats. Seasoning and other foods Herbs. Sodium-free spices. Mustard. Relish. Low-fat, low-sugar ketchup. Low-fat, low-sugar barbecue sauce. Low-fat or fat-free mayonnaise. What foods are not recommended? The items listed below may not be a complete list. Talk with your dietitian about what dietary choices are best for you. Grains Refined white flour and flour products, such as bread, pasta, snack foods, and cereals. Vegetables Canned vegetables. Frozen vegetables with butter or cream sauce. Fruits Fruits canned with syrup. Meats and other protein foods Fatty cuts of meat. Poultry with skin. Breaded or fried meat. Processed meats. Dairy Full-fat yogurt, cheese, or milk. Beverages Sweetened drinks, such as sweet iced tea and soda. Fats and oils Butter. Lard. Ghee. Sweets and desserts Baked goods, such as cake, cupcakes, pastries, cookies, and cheesecake. Seasoning and other foods Spice mixes with added salt. Ketchup. Barbecue sauce. Mayonnaise. Summary  To prevent diabetes from developing, you may need to make diet and other lifestyle changes to help control blood sugar, improve cholesterol levels, and manage your blood pressure.  Set weight loss goals with the help of your health care team. It is recommended that most people with prediabetes lose 7 percent of their current body weight.  Consider following a Mediterranean diet that includes plenty of fresh fruits and vegetables, whole grains, beans, nuts, seeds, fish, lean meat, low-fat dairy, and healthy oils. This information is not intended  to replace advice given to you by your health care provider. Make sure you discuss any questions you have with your health care provider. Document Revised: 12/22/2018 Document Reviewed: 11/03/2016 Elsevier Patient Education  2020 Elsevier Inc.    DASH Eating Plan DASH stands for "Dietary Approaches to Stop Hypertension." The DASH eating plan is a healthy eating plan that has been shown to reduce high blood pressure (hypertension). It may also reduce your risk for type 2 diabetes, heart disease, and stroke. The DASH eating plan may also help with weight loss. What are tips for following this plan?  General guidelines  Avoid eating more than 2,300 mg (milligrams) of salt (sodium) a day. If you have hypertension, you may need to reduce your sodium intake to 1,500 mg a day.  Limit alcohol intake to no more than 1 drink a day for nonpregnant women and 2 drinks a day for men. One drink equals 12 oz of beer, 5 oz of wine, or 1 oz of hard liquor.  Work with your health care provider to maintain a healthy body weight or to lose weight.  Ask what an ideal weight is for you.  Get at least 30 minutes of exercise that causes your heart to beat faster (aerobic exercise) most days of the week. Activities may include walking, swimming, or biking.  Work with your health care provider or diet and nutrition specialist (dietitian) to adjust your eating plan to your individual calorie needs. Reading food labels   Check food labels for the amount of sodium per serving. Choose foods with less than 5 percent of the Daily Value of sodium. Generally, foods with less than 300 mg of sodium per serving fit into this eating plan.  To find whole grains, look for the word "whole" as the first word in the ingredient list. Shopping  Buy products labeled as "low-sodium" or "no salt added."  Buy fresh foods. Avoid canned foods and premade or frozen meals. Cooking  Avoid adding salt when cooking. Use salt-free  seasonings or herbs instead of table salt or sea salt. Check with your health care provider or pharmacist before using salt substitutes.  Do not fry foods. Cook foods using healthy methods such as baking, boiling, grilling, and broiling instead.  Cook with heart-healthy oils, such as olive, canola, soybean, or sunflower oil. Meal planning  Eat a balanced diet that includes: ? 5 or more servings of fruits and vegetables each day. At each meal, try to fill half of your plate with fruits and vegetables. ? Up to 6-8 servings of whole grains each day. ? Less than 6 oz of lean meat, poultry, or fish each day. A 3-oz serving of meat is about the same size as a deck of cards. One egg equals 1 oz. ? 2 servings of low-fat dairy each day. ? A serving of nuts, seeds, or beans 5 times each week. ? Heart-healthy fats. Healthy fats called Omega-3 fatty acids are found in foods such as flaxseeds and coldwater fish, like sardines, salmon, and mackerel.  Limit how much you eat of the following: ? Canned or prepackaged foods. ? Food that is high in trans fat, such as fried foods. ? Food that is high in saturated fat, such as fatty meat. ? Sweets, desserts, sugary drinks, and other foods with added sugar. ? Full-fat dairy products.  Do not salt foods before eating.  Try to eat at least 2 vegetarian meals each week.  Eat more home-cooked food and less restaurant, buffet, and fast food.  When eating at a restaurant, ask that your food be prepared with less salt or no salt, if possible. What foods are recommended? The items listed may not be a complete list. Talk with your dietitian about what dietary choices are best for you. Grains Whole-grain or whole-wheat bread. Whole-grain or whole-wheat pasta. Brown rice. Orpah Cobb. Bulgur. Whole-grain and low-sodium cereals. Pita bread. Low-fat, low-sodium crackers. Whole-wheat flour tortillas. Vegetables Fresh or frozen vegetables (raw, steamed, roasted, or  grilled). Low-sodium or reduced-sodium tomato and vegetable juice. Low-sodium or reduced-sodium tomato sauce and tomato paste. Low-sodium or reduced-sodium canned vegetables. Fruits All fresh, dried, or frozen fruit. Canned fruit in natural juice (without added sugar). Meat and other protein foods Skinless chicken or [place]. Ground chicken or [place]. Pork with fat trimmed off. Fish and seafood. Egg whites. Dried beans, peas, or lentils. Unsalted nuts, nut butters, and seeds. Unsalted canned beans. Lean cuts of beef with fat trimmed off. Low-sodium, lean deli meat. Dairy Low-fat (1%) or fat-free (skim) milk. Fat-free, low-fat, or reduced-fat cheeses. Nonfat, low-sodium ricotta or cottage cheese. Low-fat or nonfat yogurt.  Low-fat, low-sodium cheese. Fats and oils Soft margarine without trans fats. Vegetable oil. Low-fat, reduced-fat, or light mayonnaise and salad dressings (reduced-sodium). Canola, safflower, olive, soybean, and sunflower oils. Avocado. Seasoning and other foods Herbs. Spices. Seasoning mixes without salt. Unsalted popcorn and pretzels. Fat-free sweets. What foods are not recommended? The items listed may not be a complete list. Talk with your dietitian about what dietary choices are best for you. Grains Baked goods made with fat, such as croissants, muffins, or some breads. Dry pasta or rice meal packs. Vegetables Creamed or fried vegetables. Vegetables in a cheese sauce. Regular canned vegetables (not low-sodium or reduced-sodium). Regular canned tomato sauce and paste (not low-sodium or reduced-sodium). Regular tomato and vegetable juice (not low-sodium or reduced-sodium). Rosita Fire. Olives. Fruits Canned fruit in a light or heavy syrup. Fried fruit. Fruit in cream or butter sauce. Meat and other protein foods Fatty cuts of meat. Ribs. Fried meat. Tomasa Blase. Sausage. Bologna and other processed lunch meats. Salami. Fatback. Hotdogs. Bratwurst. Salted nuts and seeds. Canned beans with  added salt. Canned or smoked fish. Whole eggs or egg yolks. Chicken or [place] with skin. Dairy Whole or 2% milk, cream, and half-and-half. Whole or full-fat cream cheese. Whole-fat or sweetened yogurt. Full-fat cheese. Nondairy creamers. Whipped toppings. Processed cheese and cheese spreads. Fats and oils Butter. Stick margarine. Lard. Shortening. Ghee. Bacon fat. Tropical oils, such as coconut, palm kernel, or palm oil. Seasoning and other foods Salted popcorn and pretzels. Onion salt, garlic salt, seasoned salt, table salt, and sea salt. Worcestershire sauce. Tartar sauce. Barbecue sauce. Teriyaki sauce. Soy sauce, including reduced-sodium. Steak sauce. Canned and packaged gravies. Fish sauce. Oyster sauce. Cocktail sauce. Horseradish that you find on the shelf. Ketchup. Mustard. Meat flavorings and tenderizers. Bouillon cubes. Hot sauce and Tabasco sauce. Premade or packaged marinades. Premade or packaged taco seasonings. Relishes. Regular salad dressings. Where to find more information:  National Heart, Lung, and Blood Institute: PopSteam.is  American Heart Association: www.heart.org Summary  The DASH eating plan is a healthy eating plan that has been shown to reduce high blood pressure (hypertension). It may also reduce your risk for type 2 diabetes, heart disease, and stroke.  With the DASH eating plan, you should limit salt (sodium) intake to 2,300 mg a day. If you have hypertension, you may need to reduce your sodium intake to 1,500 mg a day.  When on the DASH eating plan, aim to eat more fresh fruits and vegetables, whole grains, lean proteins, low-fat dairy, and heart-healthy fats.  Work with your health care provider or diet and nutrition specialist (dietitian) to adjust your eating plan to your individual calorie needs. This information is not intended to replace advice given to you by your health care provider. Make sure you discuss any questions you have with your health care  provider. Document Revised: 08/12/2017 Document Reviewed: 08/23/2016 Elsevier Patient Education  2020 [employer].

## 2019-12-24 ENCOUNTER — Other Ambulatory Visit (INDEPENDENT_AMBULATORY_CARE_PROVIDER_SITE_OTHER): Payer: Medicare HMO

## 2019-12-24 ENCOUNTER — Other Ambulatory Visit: Payer: Self-pay

## 2019-12-24 DIAGNOSIS — R7303 Prediabetes: Secondary | ICD-10-CM | POA: Diagnosis not present

## 2019-12-24 DIAGNOSIS — E785 Hyperlipidemia, unspecified: Secondary | ICD-10-CM

## 2019-12-24 DIAGNOSIS — I10 Essential (primary) hypertension: Secondary | ICD-10-CM

## 2019-12-24 LAB — CBC WITH DIFFERENTIAL/PLATELET
Basophils Absolute: 0 10*3/uL (ref 0.0–0.1)
Basophils Relative: 0.9 % (ref 0.0–3.0)
Eosinophils Absolute: 0.1 10*3/uL (ref 0.0–0.7)
Eosinophils Relative: 3.2 % (ref 0.0–5.0)
HCT: 39.1 % (ref 36.0–46.0)
Hemoglobin: 13 g/dL (ref 12.0–15.0)
Lymphocytes Relative: 39.6 % (ref 12.0–46.0)
Lymphs Abs: 1.4 10*3/uL (ref 0.7–4.0)
MCHC: 33.2 g/dL (ref 30.0–36.0)
MCV: 88.4 fl (ref 78.0–100.0)
Monocytes Absolute: 0.4 10*3/uL (ref 0.1–1.0)
Monocytes Relative: 10.1 % (ref 3.0–12.0)
Neutro Abs: 1.7 10*3/uL (ref 1.4–7.7)
Neutrophils Relative %: 46.2 % (ref 43.0–77.0)
Platelets: 205 10*3/uL (ref 150.0–400.0)
RBC: 4.42 Mil/uL (ref 3.87–5.11)
RDW: 14.6 % (ref 11.5–15.5)
WBC: 3.7 10*3/uL — ABNORMAL LOW (ref 4.0–10.5)

## 2019-12-24 LAB — LIPID PANEL
Cholesterol: 165 mg/dL (ref 0–200)
HDL: 53.9 mg/dL (ref >39.00)
LDL Cholesterol: 92 mg/dL (ref 0–99)
NonHDL: 110.79
Total CHOL/HDL Ratio: 3
Triglycerides: 92 mg/dL (ref 0.0–149.0)
VLDL: 18.4 mg/dL (ref 0.0–40.0)

## 2019-12-24 LAB — COMPREHENSIVE METABOLIC PANEL
ALT: 11 U/L (ref 0–35)
AST: 13 U/L (ref 0–37)
Albumin: 4.3 g/dL (ref 3.5–5.2)
Alkaline Phosphatase: 62 U/L (ref 39–117)
BUN: 16 mg/dL (ref 6–23)
CO2: 28 mEq/L (ref 19–32)
Calcium: 9.3 mg/dL (ref 8.4–10.5)
Chloride: 107 mEq/L (ref 96–112)
Creatinine, Ser: 1.04 mg/dL (ref 0.40–1.20)
GFR: 62.87 mL/min (ref >60.00)
Glucose, Bld: 101 mg/dL — ABNORMAL HIGH (ref 70–99)
Potassium: 3.7 mEq/L (ref 3.5–5.1)
Sodium: 142 mEq/L (ref 135–145)
Total Bilirubin: 1.4 mg/dL — ABNORMAL HIGH (ref 0.2–1.2)
Total Protein: 6.4 g/dL (ref 6.0–8.3)

## 2019-12-24 LAB — HEMOGLOBIN A1C: Hgb A1c MFr Bld: 6.1 % (ref 4.6–6.5)

## 2020-02-14 ENCOUNTER — Encounter: Payer: Self-pay | Admitting: Internal Medicine

## 2020-03-25 ENCOUNTER — Ambulatory Visit
Admission: RE | Admit: 2020-03-25 | Discharge: 2020-03-25 | Disposition: A | Discharge status: Home / Self Care | Payer: Medicare HMO | Source: Ambulatory Visit | Attending: Internal Medicine | Admitting: Internal Medicine

## 2020-03-25 DIAGNOSIS — E2839 Other primary ovarian failure: Secondary | ICD-10-CM | POA: Insufficient documentation

## 2020-03-25 DIAGNOSIS — M8580 Other specified disorders of bone density and structure, unspecified site: Secondary | ICD-10-CM | POA: Insufficient documentation

## 2020-03-25 DIAGNOSIS — M8589 Other specified disorders of bone density and structure, multiple sites: Secondary | ICD-10-CM | POA: Diagnosis not present

## 2020-03-25 DIAGNOSIS — Z1231 Encounter for screening mammogram for malignant neoplasm of breast: Secondary | ICD-10-CM | POA: Insufficient documentation

## 2020-03-25 DIAGNOSIS — Z780 Asymptomatic menopausal state: Secondary | ICD-10-CM | POA: Diagnosis not present

## 2020-03-27 ENCOUNTER — Encounter: Payer: Self-pay | Admitting: Internal Medicine

## 2020-03-27 LAB — COLOGUARD

## 2020-05-12 ENCOUNTER — Ambulatory Visit: Payer: Medicare HMO | Attending: Critical Care Medicine

## 2020-05-12 DIAGNOSIS — Z23 Encounter for immunization: Secondary | ICD-10-CM

## 2020-05-12 NOTE — Progress Notes (Signed)
° °  [facility]ov[facility] Vaccination [facility]  Name:  Stacy Vasquez    MRN: [id number] DOB: [birth year]  05/12/2020  Stacy Vasquez was observed post Covid-19 immunization for 15 minutes without incident. She was provided with Vaccine Information Sheet and instruction to access the V-Safe system.   Stacy Vasquez was instructed to call 911 with any severe reactions post vaccine:  Difficulty breathing   Swelling of face and throat   A fast heartbeat   A bad rash all over body   Dizziness and weakness

## 2020-06-17 DIAGNOSIS — H2513 Age-related nuclear cataract, bilateral: Secondary | ICD-10-CM | POA: Diagnosis not present

## 2020-06-18 ENCOUNTER — Ambulatory Visit (INDEPENDENT_AMBULATORY_CARE_PROVIDER_SITE_OTHER): Payer: Medicare HMO | Admitting: Internal Medicine

## 2020-06-18 ENCOUNTER — Encounter: Payer: Self-pay | Admitting: Internal Medicine

## 2020-06-18 ENCOUNTER — Ambulatory Visit: Payer: Medicare HMO | Admitting: Internal Medicine

## 2020-06-18 ENCOUNTER — Other Ambulatory Visit: Payer: Self-pay

## 2020-06-18 VITALS — BP 134/90 | HR 73 | Temp 97.8°F | Ht 67.01 in | Wt 215.2 lb

## 2020-06-18 DIAGNOSIS — R7303 Prediabetes: Secondary | ICD-10-CM | POA: Diagnosis not present

## 2020-06-18 DIAGNOSIS — I10 Essential (primary) hypertension: Secondary | ICD-10-CM

## 2020-06-18 DIAGNOSIS — Z1389 Encounter for screening for other disorder: Secondary | ICD-10-CM | POA: Diagnosis not present

## 2020-06-18 DIAGNOSIS — Z1329 Encounter for screening for other suspected endocrine disorder: Secondary | ICD-10-CM | POA: Diagnosis not present

## 2020-06-18 DIAGNOSIS — E785 Hyperlipidemia, unspecified: Secondary | ICD-10-CM

## 2020-06-18 MED ORDER — TELMISARTAN-HCTZ 80-25 MG PO TABS: 1.0000 | ORAL_TABLET | Freq: Every day | ORAL | 3 refills | Status: DC

## 2020-06-18 NOTE — Progress Notes (Signed)
Chief Complaint  Patient presents with  . Follow-up    Pt c/o arthritis pain in left leg.   . Immunizations    pt declined flu shot   F/u  1. BP elevated today on micardisc 80-25 generic thinks related to change in [facility] also doing more sparkling water  2. Wants cologaurd order renewed    Review of Systems  Constitutional: Negative for malaise/fatigue and weight loss.  HENT: Negative for hearing loss.   Eyes: Negative for blurred vision.  Respiratory: Negative for shortness of breath.   Cardiovascular: Negative for chest pain.  Gastrointestinal: Negative for abdominal pain.  Musculoskeletal: Negative for falls.  Skin: Negative for rash.  Neurological: Negative for headaches.  Psychiatric/Behavioral: Negative for depression.   Past Medical History:  Diagnosis Date  . Blood transfusion without reported diagnosis   . Cataract    ? which eye wears glasses   . Hyperlipidemia   . Hypertension   . Prediabetes    Past Surgical History:  Procedure Laterality Date  . ABDOMINAL HYSTERECTOMY     over 30 years fibroids no h/o abnormal pap    Family History  Problem Relation Age of Onset  . Arthritis Mother   . Diabetes Mother   . Hypertension Mother   . Alcohol abuse Father   . Arthritis Father   . Dementia Father   . Stroke Sister   . Congestive Heart Failure Sister   . Other Sister        09/13/19 covid 19+   Social History   Socioeconomic History  . Marital status: Single    Spouse name: Not on file  . Number of children: 0  . Years of education: Not on file  . Highest education level: Not on file  Occupational History  . Not on file  Tobacco Use  . Smoking status: Former Games developer  . Smokeless tobacco: Never Used  . Tobacco comment: in 30s smoked x 2 years max 4-5 cig/day  Substance and Sexual Activity  . Alcohol use: No  . Drug use: No  . Sexual activity: Never  Other Topics Concern  . 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 Difficulty of Paying Living Expenses: Not on file  Food Insecurity:   . Worried About Programme researcher, broadcasting/film/video in the Last Year: Not on file  . Ran Out of Food in the Last Year: Not on file  Transportation Needs:   . Lack of Transportation (Medical): Not on file  . Lack of Transportation (Non-Medical): Not on file  Physical Activity:   . Days of Exercise per Week: Not on file  . Minutes of Exercise per Session: Not on file  Stress:   . Feeling of Stress : Not on file  Social Connections:   . Frequency of Communication with Friends and Family: Not on file  . Frequency of Social Gatherings with Friends and Family: Not on file  . Attends Religious Services: Not on file  . Active Member of Clubs or Organizations: Not on file  . Attends Banker Meetings: Not on file  . Marital Status: Not on file  Intimate Partner Violence:   . Fear of Current or Ex-Partner: Not on file  .  Emotionally Abused: Not on file  . Physically Abused: Not on file  . Sexually Abused: Not on file   Current Meds  Medication Sig  . Cholecalciferol (VITAMIN D3) 1000 units CAPS Take by mouth.  . cyanocobalamin 1000 MCG tablet Take 1,000 mcg by mouth daily.  . Multiple Vitamins-Minerals (MULTIVITAMIN WOMEN) TABS Take by mouth daily.  . pravastatin (PRAVACHOL) 20 MG tablet Take 1 tablet (20 mg total) by mouth at bedtime.  Marland Kitchen telmisartan-hydrochlorothiazide (MICARDIS HCT) 80-25 MG tablet Take 1 tablet by mouth daily. In am  . Turmeric (QC TUMERIC COMPLEX PO) Take by mouth.  . [DISCONTINUED] telmisartan-hydrochlorothiazide (MICARDIS HCT) 80-25 MG tablet Take 1 tablet by mouth daily. In am   Allergies  Allergen Reactions  . Lipitor [Atorvastatin Calcium]     feet, hands, ankles swelling per pt    . Penicillins      Vaginal infections   Recent Results (from the past 2160 hour(s))  Cologuard     Status: None   Collection Time: 03/27/20 12:00 AM  Result Value Ref Range   Cologuard     Objective  Body mass index is 33.7 kg/m. Wt Readings from Last 3 Encounters:  06/18/20 215 lb 3.2 oz (97.6 kg)  12/18/19 211 lb 6.4 oz (95.9 kg)  08/14/19 214 lb 6.4 oz (97.3 kg)   Temp Readings from Last 3 Encounters:  06/18/20 97.8 F (36.6 C)  12/18/19 97.7 F (36.5 C) (Temporal)  05/04/19 97.6 F (36.4 C) (Oral)   BP Readings from Last 3 Encounters:  06/18/20 134/90  12/18/19 132/86  05/04/19 132/78   Pulse Readings from Last 3 Encounters:  06/18/20 73  12/18/19 85  05/04/19 73    Physical Exam Vitals and nursing note reviewed.  Constitutional:      Appearance: Normal appearance. She is well-developed and well-groomed. She is obese.  HENT:     Head: Normocephalic and atraumatic.  Eyes:     Conjunctiva/sclera: Conjunctivae normal.     Pupils: Pupils are equal, round, and reactive to light.  Cardiovascular:     Rate and Rhythm: Normal rate and regular rhythm.     Heart sounds: Normal heart sounds. No murmur heard.   Pulmonary:     Effort: Pulmonary effort is normal.     Breath sounds: Normal breath sounds.  Abdominal:     Tenderness: There is no abdominal tenderness.  Skin:    General: Skin is warm and dry.  Neurological:     General: No focal deficit present.     Mental Status: She is alert and oriented to person, place, and time. Mental status is at baseline.     Gait: Gait normal.  Psychiatric:        Attention and Perception: Attention and perception normal.        Mood and Affect: Mood and affect normal.        Speech: Speech normal.        Behavior: Behavior normal. Behavior is cooperative.        Thought Content: Thought content normal.        Cognition and Memory: Cognition normal.        Judgment: Judgment normal.     Assessment  Plan  Essential hypertension -  Plan: telmisartan-hydrochlorothiazide (MICARDIS HCT) 80-25 MG tablet, Comprehensive metabolic panel, Lipid panel, CBC with Differential/Platelet BP check with labs   Hyperlipidemia, unspecified hyperlipidemia type - Plan: Lipid panel Cont pravastatin 20 mg qhs   Thyroid disorder screening - Plan: TSH  Prediabetes - Plan: Hemoglobin A1c   HM Declines flu shotand other vaccines (Tdap, prevnar, pna 23, Tdap, shingrix) Hep c neg Pfizer 2/2   Hascologaurdnot done yet -reordered today plans to do  Out of pap window s/p hysterectomy no h/o abnormal pap  mammo7/13/2021negativedeclines breast exam prev   DEXA 12/08/16 osteopenia rec Calcium 600 mg bid and vit D3 1000-2000  IU qd. rec repeat in 3-5 years.   Former smoker in 30s x 2 years max 4-5 cig per day.  H/o cataracts stable referred to [facility] eye Dr. Georgia Dom 05/2019 eyes ok  Provider: Dr. French Ana McLean-Scocuzza-Internal Medicine

## 2020-06-18 NOTE — Patient Instructions (Addendum)
<130/<80 goal blood pressure    Food source of calcium  Exercise walking  Vitamin D3 1000 to 2000 IU daily   PLEASE DO COLOGUARD ON Monday  [place] at [employer]    Hypertension, Adult High blood pressure (hypertension) is when the force of blood pumping through the arteries is too strong. The arteries are the blood vessels that carry blood from the heart throughout the body. Hypertension forces the heart to work harder to pump blood and may cause arteries to become narrow or stiff. Untreated or uncontrolled hypertension can cause a heart attack, heart failure, a stroke, kidney disease, and other problems. A blood pressure reading consists of a higher number over a lower number. Ideally, your blood pressure should be below 120/80. The first ("top") number is called the systolic pressure. It is a measure of the pressure in your arteries as your heart beats. The second ("bottom") number is called the diastolic pressure. It is a measure of the pressure in your arteries as the heart relaxes. What are the causes? The exact cause of this condition is not known. There are some conditions that result in or are related to high blood pressure. What increases the risk? Some risk factors for high blood pressure are under your control. The following factors may make you more likely to develop this condition:  Smoking.  Having type 2 diabetes mellitus, high cholesterol, or both.  Not getting enough exercise or physical activity.  Being overweight.  Having too much fat, sugar, calories, or salt (sodium) in your diet.  Drinking too much alcohol. Some risk factors for high blood pressure may be difficult or impossible to change. Some of these factors include:  Having chronic kidney disease.  Having a family history of high blood pressure.  Age. Risk increases with age.  Race. You may be at higher risk if you are African American.  Gender. Men are at higher risk than women before  age 145. After age 73, women are at higher risk than men.  Having obstructive sleep apnea.  Stress. What are the signs or symptoms? High blood pressure may not cause symptoms. Very high blood pressure (hypertensive crisis) may cause:  Headache.  Anxiety.  Shortness of breath.  Nosebleed.  Nausea and vomiting.  Vision changes.  Severe chest pain.  Seizures. How is this diagnosed? This condition is diagnosed by measuring your blood pressure while you are seated, with your arm resting on a flat surface, your legs uncrossed, and your feet flat on the floor. The cuff of the blood pressure monitor will be placed directly against the skin of your upper arm at the level of your heart. It should be measured at least twice using the same arm. Certain conditions can cause a difference in blood pressure between your right and left arms. Certain factors can cause blood pressure readings to be lower or higher than normal for a short period of time:  When your blood pressure is higher when you are in a health care provider's office than when you are at home, this is called white coat hypertension. Most people with this condition do not need medicines.  When your blood pressure is higher at home than when you are in a health care provider's office, this is called masked hypertension. Most people with this condition may need medicines to control blood pressure. If you have a high blood pressure reading during one visit or you have normal blood pressure with other risk factors, you may be asked  to:  Return on a different day to have your blood pressure checked again.  Monitor your blood pressure at home for 1 week or longer. If you are diagnosed with hypertension, you may have other blood or imaging tests to help your health care provider understand your overall risk for other conditions. How is this treated? This condition is treated by making healthy lifestyle changes, such as eating healthy  foods, exercising more, and reducing your alcohol intake. Your health care provider may prescribe medicine if lifestyle changes are not enough to get your blood pressure under control, and if:  Your systolic blood pressure is above 130.  Your diastolic blood pressure is above 80. Your personal target blood pressure may vary depending on your medical conditions, your age, and other factors. Follow these instructions at home: Eating and drinking   Eat a diet that is high in fiber and potassium, and low in sodium, added sugar, and fat. An example eating plan is called the DASH (Dietary Approaches to Stop Hypertension) diet. To eat this way: ? Eat plenty of fresh fruits and vegetables. Try to fill one half of your plate at each meal with fruits and vegetables. ? Eat whole grains, such as whole-wheat pasta, brown rice, or whole-grain bread. Fill about one fourth of your plate with whole grains. ? Eat or drink low-fat dairy products, such as skim milk or low-fat yogurt. ? Avoid fatty cuts of meat, processed or cured meats, and poultry with skin. Fill about one fourth of your plate with lean proteins, such as fish, chicken without skin, beans, eggs, or tofu. ? Avoid pre-made and processed foods. These tend to be higher in sodium, added sugar, and fat.  Reduce your daily sodium intake. Most people with hypertension should eat less than 1,500 mg of sodium a day.  Do not drink alcohol if: ? Your health care provider tells you not to drink. ? You are pregnant, may be pregnant, or are planning to become pregnant.  If you drink alcohol: ? Limit how much you use to:  0-1 drink a day for women.  0-2 drinks a day for men. ? Be aware of how much alcohol is in your drink. In the [place] one drink equals one 12 oz bottle of beer (355 mL), one 5 oz glass of wine (148 mL), or one 1 oz glass of hard liquor (44 mL). Lifestyle   Work with your health care provider to maintain a healthy body weight or to lose  weight. Ask what an ideal weight is for you.  Get at least 30 minutes of exercise most days of the week. Activities may include walking, swimming, or biking.  Include exercise to strengthen your muscles (resistance exercise), such as Pilates or lifting weights, as part of your weekly exercise routine. Try to do these types of exercises for 30 minutes at least 3 days a week.  Do not use any products that contain nicotine or tobacco, such as cigarettes, e-cigarettes, and chewing tobacco. If you need help quitting, ask your health care provider.  Monitor your blood pressure at home as told by your health care provider.  Keep all follow-up visits as told by your health care provider. This is important. Medicines  Take over-the-counter and prescription medicines only as told by your health care provider. Follow directions carefully. Blood pressure medicines must be taken as prescribed.  Do not skip doses of blood pressure medicine. Doing this puts you at risk for problems and can make the medicine  less effective.  Ask your health care provider about side effects or reactions to medicines that you should watch for. Contact a health care provider if you:  Think you are having a reaction to a medicine you are taking.  Have headaches that keep coming back (recurring).  Feel dizzy.  Have swelling in your ankles.  Have trouble with your vision. Get help right away if you:  Develop a severe headache or confusion.  Have unusual weakness or numbness.  Feel faint.  Have severe pain in your chest or abdomen.  Vomit repeatedly.  Have trouble breathing. Summary  Hypertension is when the force of blood pumping through your arteries is too strong. If this condition is not controlled, it may put you at risk for serious complications.  Your personal target blood pressure may vary depending on your medical conditions, your age, and other factors. For most people, a normal blood pressure is  less than 120/80.  Hypertension is treated with lifestyle changes, medicines, or a combination of both. Lifestyle changes include losing weight, eating a healthy, low-sodium diet, exercising more, and limiting alcohol. This information is not intended to replace advice given to you by your health care provider. Make sure you discuss any questions you have with your health care provider. Document Revised: 05/10/2018 Document Reviewed: 05/10/2018 Elsevier Patient Education  [place] DASH stands for "Dietary Approaches to Stop Hypertension." The DASH eating plan is a healthy eating plan that has been shown to reduce high blood pressure (hypertension). It may also reduce your risk for type 2 diabetes, heart disease, and stroke. The DASH eating plan may also help with weight loss. What are tips for following this plan?  General guidelines  Avoid eating more than 2,300 mg (milligrams) of salt (sodium) a day. If you have hypertension, you may need to reduce your sodium intake to 1,500 mg a day.  Limit alcohol intake to no more than 1 drink a day for nonpregnant women and 2 drinks a day for men. One drink equals 12 oz of beer, 5 oz of wine, or 1 oz of hard liquor.  Work with your health care provider to maintain a healthy body weight or to lose weight. Ask what an ideal weight is for you.  Get at least 30 minutes of exercise that causes your heart to beat faster (aerobic exercise) most days of the week. Activities may include walking, swimming, or biking.  Work with your health care provider or diet and nutrition specialist (dietitian) to adjust your eating plan to your individual calorie needs. Reading food labels   Check food labels for the amount of sodium per serving. Choose foods with less than 5 percent of the Daily Value of sodium. Generally, foods with less than 300 mg of sodium per serving fit into this eating plan.  To find whole grains, look for the word  "whole" as the first word in the ingredient list. Shopping  Buy products labeled as "low-sodium" or "no salt added."  Buy fresh foods. Avoid canned foods and premade or frozen meals. Cooking  Avoid adding salt when cooking. Use salt-free seasonings or herbs instead of table salt or sea salt. Check with your health care provider or pharmacist before using salt substitutes.  Do not fry foods. Cook foods using healthy methods such as baking, boiling, grilling, and broiling instead.  Cook with heart-healthy oils, such as olive, canola, soybean, or sunflower oil. Meal planning  Eat a balanced diet that  includes: ? 5 or more servings of fruits and vegetables each day. At each meal, try to fill half of your plate with fruits and vegetables. ? Up to 6-8 servings of whole grains each day. ? Less than 6 oz of lean meat, poultry, or fish each day. A 3-oz serving of meat is about the same size as a deck of cards. One egg equals 1 oz. ? 2 servings of low-fat dairy each day. ? A serving of nuts, seeds, or beans 5 times each week. ? Heart-healthy fats. Healthy fats called Omega-3 fatty acids are found in foods such as flaxseeds and coldwater fish, like sardines, salmon, and mackerel.  Limit how much you eat of the following: ? Canned or prepackaged foods. ? Food that is high in trans fat, such as fried foods. ? Food that is high in saturated fat, such as fatty meat. ? Sweets, desserts, sugary drinks, and other foods with added sugar. ? Full-fat dairy products.  Do not salt foods before eating.  Try to eat at least 2 vegetarian meals each week.  Eat more home-cooked food and less restaurant, buffet, and fast food.  When eating at a restaurant, ask that your food be prepared with less salt or no salt, if possible. What foods are recommended? The items listed may not be a complete list. Talk with your dietitian about what dietary choices are best for you. Grains Whole-grain or whole-wheat  bread. Whole-grain or whole-wheat pasta. Brown rice. [place]. Bulgur. Whole-grain and low-sodium cereals. Pita bread. Low-fat, low-sodium crackers. Whole-wheat flour tortillas. Vegetables Fresh or frozen vegetables (raw, steamed, roasted, or grilled). Low-sodium or reduced-sodium tomato and vegetable juice. Low-sodium or reduced-sodium tomato sauce and tomato paste. Low-sodium or reduced-sodium canned vegetables. Fruits All fresh, dried, or frozen fruit. Canned fruit in natural juice (without added sugar). Meat and other protein foods Skinless chicken or [place]. Ground chicken or [place]. Pork with fat trimmed off. Fish and seafood. Egg whites. Dried beans, peas, or lentils. Unsalted nuts, nut butters, and seeds. Unsalted canned beans. Lean cuts of beef with fat trimmed off. Low-sodium, lean deli meat. Dairy Low-fat (1%) or fat-free (skim) milk. Fat-free, low-fat, or reduced-fat cheeses. Nonfat, low-sodium ricotta or cottage cheese. Low-fat or nonfat yogurt. Low-fat, low-sodium cheese. Fats and oils Soft margarine without trans fats. Vegetable oil. Low-fat, reduced-fat, or light mayonnaise and salad dressings (reduced-sodium). Canola, safflower, olive, soybean, and sunflower oils. Avocado. Seasoning and other foods Herbs. Spices. Seasoning mixes without salt. Unsalted popcorn and pretzels. Fat-free sweets. What foods are not recommended? The items listed may not be a complete list. Talk with your dietitian about what dietary choices are best for you. Grains Baked goods made with fat, such as croissants, muffins, or some breads. Dry pasta or rice meal packs. Vegetables Creamed or fried vegetables. Vegetables in a cheese sauce. Regular canned vegetables (not low-sodium or reduced-sodium). Regular canned tomato sauce and paste (not low-sodium or reduced-sodium). Regular tomato and vegetable juice (not low-sodium or reduced-sodium). Angie Fava. Olives. Fruits Canned fruit in a light or heavy  syrup. Fried fruit. Fruit in cream or butter sauce. Meat and other protein foods Fatty cuts of meat. Ribs. Fried meat. Berniece Salines. Sausage. Bologna and other processed lunch meats. Salami. Fatback. Hotdogs. Bratwurst. Salted nuts and seeds. Canned beans with added salt. Canned or smoked fish. Whole eggs or egg yolks. Chicken or [place] with skin. Dairy Whole or 2% milk, cream, and half-and-half. Whole or full-fat cream cheese. Whole-fat or sweetened yogurt. Full-fat cheese. Nondairy creamers. Whipped  toppings. Processed cheese and cheese spreads. Fats and oils Butter. Stick margarine. Lard. Shortening. Ghee. Bacon fat. Tropical oils, such as coconut, palm kernel, or palm oil. Seasoning and other foods Salted popcorn and pretzels. Onion salt, garlic salt, seasoned salt, table salt, and sea salt. Worcestershire sauce. Tartar sauce. Barbecue sauce. Teriyaki sauce. Soy sauce, including reduced-sodium. Steak sauce. Canned and packaged gravies. Fish sauce. Oyster sauce. Cocktail sauce. Horseradish that you find on the shelf. Ketchup. Mustard. Meat flavorings and tenderizers. Bouillon cubes. Hot sauce and Tabasco sauce. Premade or packaged marinades. Premade or packaged taco seasonings. Relishes. Regular salad dressings. Where to find more information:  National Heart, Lung, and Blood Institute: PopSteam.is  American Heart Association: www.heart.org Summary  The DASH eating plan is a healthy eating plan that has been shown to reduce high blood pressure (hypertension). It may also reduce your risk for type 2 diabetes, heart disease, and stroke.  With the DASH eating plan, you should limit salt (sodium) intake to 2,300 mg a day. If you have hypertension, you may need to reduce your sodium intake to 1,500 mg a day.  When on the DASH eating plan, aim to eat more fresh fruits and vegetables, whole grains, lean proteins, low-fat dairy, and heart-healthy fats.  Work with your health care provider or diet and  nutrition specialist (dietitian) to adjust your eating plan to your individual calorie needs. This information is not intended to replace advice given to you by your health care provider. Make sure you discuss any questions you have with your health care provider. Document Revised: 08/12/2017 Document Reviewed: 08/23/2016 Elsevier Patient Education  2020 Elsevier Inc.   Lymphedema  Lymphedema is swelling that is caused by the abnormal collection of lymph in the tissues under the skin. Lymph is fluid from the tissues in your body that is removed through the lymphatic system. This system is part of your body's defense system (immune system) and includes lymph nodes and lymph vessels. The lymph vessels collect and carry the excess fluid, fats, proteins, and wastes from the tissues of the body to the bloodstream. This system also works to clean and remove bacteria and waste products from the body. Lymphedema occurs when the lymphatic system is blocked. When the lymph vessels or lymph nodes are blocked or damaged, lymph does not drain properly. This causes an abnormal buildup of lymph, which leads to swelling in the affected area. This may include the trunk area, or an arm or leg. Lymphedema cannot be cured by medicines, but various methods can be used to help reduce the swelling. There are two types of lymphedema: primary lymphedema and secondary lymphedema. What are the causes? The cause of this condition depends on the type of lymphedema that you have.  Primary lymphedema is caused by the absence of lymph vessels or having abnormal lymph vessels at birth.  Secondary lymphedema occurs when lymph vessels are blocked or damaged. Secondary lymphedema is more common. Common causes of lymph vessel blockage include: ? Skin infection, such as cellulitis. ? Infection by parasites (filariasis). ? Injury. ? Radiation therapy. ? Cancer. ? Formation of scar tissue. ? Surgery. What are the signs or  symptoms? Symptoms of this condition include:  Swelling of the arm or leg.  A heavy or tight feeling in the arm or leg.  Swelling of the feet, toes, or fingers. Shoes or rings may fit more tightly than before.  Redness of the skin over the affected area.  Limited movement of the affected limb.  Sensitivity  to touch or discomfort in the affected limb. How is this diagnosed? This condition may be diagnosed based on:  Your symptoms and medical history.  A physical exam.  Bioimpedance spectroscopy. In this test, painless electrical currents are used to measure fluid levels in your body.  Imaging tests, such as: ? Lymphoscintigraphy. In this test, a low dose of a radioactive substance is injected to trace the flow of lymph through the lymph vessels. ? MRI. ? CT scan. ? Duplex ultrasound. This test uses sound waves to produce images of the vessels and the blood flow on a screen. ? Lymphangiography. In this test, a contrast dye is injected into the lymph vessel to help show blockages. How is this treated? Treatment for this condition may depend on the cause of your lymphedema. Treatment may include:  Complete decongestive therapy (CDT). This is done by a certified lymphedema therapist to reduce fluid congestion. This therapy includes: ? Manual lymph drainage. This is a special massage technique that promotes lymph drainage out of a limb. ? Skin care. ? Compression wrapping of the affected area. ? Specific exercises. Certain exercises can help fluid move out of the affected limb.  Compression. Various methods may be used to apply pressure to the affected limb to reduce the swelling. They include: ? Wearing compression stockings or sleeves on the affected limb. ? Wrapping the affected limb with special bandages.  Surgery. This is usually done for severe cases only. For example, surgery may be done if you have trouble moving the limb or if the swelling does not get better with other  treatments. If an underlying condition is causing the lymphedema, treatment for that condition will be done. For example, antibiotic medicines may be used to treat an infection. Follow these instructions at home: Self-care  The affected area is more likely to become injured or infected. Take these steps to help prevent infection: ? Keep the affected area clean and dry. ? Use approved creams or lotions to keep the skin moisturized. ? Protect your skin from cuts:  Use gloves while cooking or gardening.  Do not walk barefoot.  If you shave the affected area, use an Neurosurgeon.  Do not wear tight clothes, shoes, or jewelry.  Eat a healthy diet that includes a lot of fruits and vegetables. Activity  Exercise regularly as directed by your health care provider.  Do not sit with your legs crossed.  When possible, keep the affected limb raised (elevated) above the level of your heart.  Avoid carrying things with an arm that is affected by lymphedema. General instructions  Wear compression stockings or sleeves as told by your health care provider.  Note any changes in size of the affected limb. You may be instructed to take regular measurements and keep track of them.  Take over-the-counter and prescription medicines only as told by your health care provider.  If you were prescribed an antibiotic medicine, take or apply it as told by your health care provider. Do not stop using the antibiotic even if you start to feel better.  Do not use heating pads or ice packs over the affected area.  Avoid having blood draws, IV insertions, or blood pressure checked on the affected limb.  Keep all follow-up visits as told by your health care provider. This is important. Contact a health care provider if you:  Continue to have swelling in your limb.  Have a cut that does not heal.  Have redness or pain in the affected area.  Get help right away if you:  Have new swelling in your limb  that comes on suddenly.  Develop purplish spots, rash or sores (lesions) on your affected limb.  Have shortness of breath.  Have a fever or chills. Summary  Lymphedema is swelling that is caused by the abnormal collection of lymph in the tissues under the skin.  Lymph is fluid from the tissues in your body that is removed through the lymphatic system. This system collects and carries excess fluid, fats, proteins, and wastes from the tissues of the body to the bloodstream.  Lymphedema causes swelling, pain, and redness in the affected area. This may include the trunk area, or an arm or leg.  Treatment for this condition may depend on the cause of your lymphedema. Treatment may include complete decongestive therapy (CDT), compression methods, surgery, or treating the underlying cause. This information is not intended to replace advice given to you by your health care provider. Make sure you discuss any questions you have with your health care provider. Document Revised: 09/12/2017 Document Reviewed: 09/12/2017 Elsevier Patient Education  2020 [employer].

## 2020-06-23 ENCOUNTER — Telehealth: Payer: Self-pay | Admitting: Internal Medicine

## 2020-06-23 NOTE — Telephone Encounter (Signed)
Left message to return call 

## 2020-06-23 NOTE — Telephone Encounter (Signed)
Prior authorization has been denied.   "Deniedon October 7 The benefit provides coverage for the requested drug when medically necessary. However, the information submitted doesnt meet [facility] medical necessity guidelines for coverage. The member must meet the following criteria: has tried two of the following: lisinopril, lisinopril-HCTZ, ramipril, benazepril, benazepril-HCTZ, quinapril, quinapril-HCTZ, enalapril, enalapril-HCTZ, valsartan, valsartan-HCTZ, irbesartan, irbesartan-HCTZ, olmesartan, or olmesartan-HCTZ. This determination was based on the [facility] Angiotensin Blocking Agents Coverage Policy."

## 2020-06-23 NOTE — Telephone Encounter (Signed)
Prior authorization has been submitted for patient's Telmisartan/HCTZ  Awaiting approval or denial.

## 2020-06-23 NOTE — Telephone Encounter (Signed)
I believe pt wants to continue and will pay as other [employer] losartan she did not tolerate  Advise pt

## 2020-06-27 NOTE — Telephone Encounter (Signed)
Left message to return call 

## 2020-07-02 ENCOUNTER — Ambulatory Visit (INDEPENDENT_AMBULATORY_CARE_PROVIDER_SITE_OTHER): Payer: Medicare HMO

## 2020-07-02 ENCOUNTER — Other Ambulatory Visit: Payer: Self-pay

## 2020-07-02 VITALS — BP 117/75 | HR 70

## 2020-07-02 VITALS — Ht 67.01 in | Wt 215.0 lb

## 2020-07-02 DIAGNOSIS — Z1329 Encounter for screening for other suspected endocrine disorder: Secondary | ICD-10-CM

## 2020-07-02 DIAGNOSIS — E785 Hyperlipidemia, unspecified: Secondary | ICD-10-CM | POA: Diagnosis not present

## 2020-07-02 DIAGNOSIS — R7303 Prediabetes: Secondary | ICD-10-CM

## 2020-07-02 DIAGNOSIS — I10 Essential (primary) hypertension: Secondary | ICD-10-CM | POA: Diagnosis not present

## 2020-07-02 DIAGNOSIS — Z0000 Encounter for general adult medical examination without abnormal findings: Secondary | ICD-10-CM

## 2020-07-02 DIAGNOSIS — Z1389 Encounter for screening for other disorder: Secondary | ICD-10-CM

## 2020-07-02 LAB — CBC WITH DIFFERENTIAL/PLATELET
Basophils Absolute: 0 10*3/uL (ref 0.0–0.1)
Basophils Relative: 0.6 % (ref 0.0–3.0)
Eosinophils Absolute: 0.1 10*3/uL (ref 0.0–0.7)
Eosinophils Relative: 2.9 % (ref 0.0–5.0)
HCT: 38.9 % (ref 36.0–46.0)
Hemoglobin: 12.8 g/dL (ref 12.0–15.0)
Lymphocytes Relative: 32.3 % (ref 12.0–46.0)
Lymphs Abs: 1.3 10*3/uL (ref 0.7–4.0)
MCHC: 33 g/dL (ref 30.0–36.0)
MCV: 87.6 fl (ref 78.0–100.0)
Monocytes Absolute: 0.4 10*3/uL (ref 0.1–1.0)
Monocytes Relative: 9.1 % (ref 3.0–12.0)
Neutro Abs: 2.2 10*3/uL (ref 1.4–7.7)
Neutrophils Relative %: 55.1 % (ref 43.0–77.0)
Platelets: 209 10*3/uL (ref 150.0–400.0)
RBC: 4.44 Mil/uL (ref 3.87–5.11)
RDW: 14.5 % (ref 11.5–15.5)
WBC: 4.1 10*3/uL (ref 4.0–10.5)

## 2020-07-02 LAB — COMPREHENSIVE METABOLIC PANEL
ALT: 11 U/L (ref 0–35)
AST: 12 U/L (ref 0–37)
Albumin: 4.2 g/dL (ref 3.5–5.2)
Alkaline Phosphatase: 63 U/L (ref 39–117)
BUN: 14 mg/dL (ref 6–23)
CO2: 30 mEq/L (ref 19–32)
Calcium: 9 mg/dL (ref 8.4–10.5)
Chloride: 106 mEq/L (ref 96–112)
Creatinine, Ser: 0.96 mg/dL (ref 0.40–1.20)
GFR: 58.5 mL/min — ABNORMAL LOW (ref >60.00)
Glucose, Bld: 93 mg/dL (ref 70–99)
Potassium: 3.8 mEq/L (ref 3.5–5.1)
Sodium: 143 mEq/L (ref 135–145)
Total Bilirubin: 1.2 mg/dL (ref 0.2–1.2)
Total Protein: 6.2 g/dL (ref 6.0–8.3)

## 2020-07-02 LAB — LIPID PANEL
Cholesterol: 147 mg/dL (ref 0–200)
HDL: 58.2 mg/dL (ref >39.00)
LDL Cholesterol: 74 mg/dL (ref 0–99)
NonHDL: 89.03
Total CHOL/HDL Ratio: 3
Triglycerides: 76 mg/dL (ref 0.0–149.0)
VLDL: 15.2 mg/dL (ref 0.0–40.0)

## 2020-07-02 LAB — HEMOGLOBIN A1C: Hgb A1c MFr Bld: 6.4 % (ref 4.6–6.5)

## 2020-07-02 LAB — TSH: TSH: 1.17 u[IU]/mL (ref 0.35–4.50)

## 2020-07-02 NOTE — Progress Notes (Signed)
Patient is here for a BP check due to bp being high at last visit, as per patient.  Currently patients BP is 119/75 and BPM is 70.  Patient has no complaints of headaches, blurry vision, chest pain, arm pain, light headedness, dizziness, and nor jaw pain. Please see previous note for order.

## 2020-07-02 NOTE — Progress Notes (Signed)
Subjective:   Jalea Bronaugh is a 74 y.o. female who presents for Medicare Annual (Subsequent) preventive examination.  Review of Systems    No ROS.  Medicare Wellness Virtual Visit.    Cardiac Risk Factors include: advanced age (>88men, >47 women);hypertension     Objective:    Today's Vitals   07/02/20 1250  Weight: 215 lb (97.5 kg)  Height: 5' 7.01" (1.702 m)   Body mass index is 33.66 kg/m.  Advanced Directives 07/02/2020 07/02/2019 06/23/2018  Does Patient Have a Medical Advance Directive? No No No  Does patient want to make changes to medical advance directive? - - Yes (MAU/Ambulatory/Procedural Areas - Information given)  Would patient like information on creating a medical advance directive? Yes (MAU/Ambulatory/Procedural Areas - Information given) No - Patient declined -    Current Medications (verified) Outpatient Encounter Medications as of 07/02/2020  Medication Sig  . Cholecalciferol (VITAMIN D3) 1000 units CAPS Take by mouth.  . cyanocobalamin 1000 MCG tablet Take 1,000 mcg by mouth daily.  . Multiple Vitamins-Minerals (MULTIVITAMIN WOMEN) TABS Take by mouth daily.  . pravastatin (PRAVACHOL) 20 MG tablet Take 1 tablet (20 mg total) by mouth at bedtime.  Marland Kitchen telmisartan-hydrochlorothiazide (MICARDIS HCT) 80-25 MG tablet Take 1 tablet by mouth daily. In am  . Turmeric (QC TUMERIC COMPLEX PO) Take by mouth.   No facility-administered encounter medications on file as of 07/02/2020.    Allergies (verified) Lipitor [atorvastatin calcium] and Penicillins   History: Past Medical History:  Diagnosis Date  . Blood transfusion without reported diagnosis   . Cataract    ? which eye wears glasses   . Hyperlipidemia   . Hypertension   . Prediabetes    Past Surgical History:  Procedure Laterality Date  . ABDOMINAL HYSTERECTOMY     over 30 years fibroids no h/o abnormal pap    Family History  Problem Relation Age of Onset  . Arthritis Mother   . Diabetes  Mother   . Hypertension Mother   . Alcohol abuse Father   . Arthritis Father   . Dementia Father   . Stroke Sister   . Congestive Heart Failure Sister   . Other Sister        09/13/19 covid 19+  . Leukemia Sister    Social History   Socioeconomic History  . Marital status: Single    Spouse name: Not on file  . Number of children: 0  . Years of education: Not on file  . Highest education level: Not on file  Occupational History  . Not on file  Tobacco Use  . Smoking status: Former Games developer  . Smokeless tobacco: Never Used  . Tobacco comment: in 30s smoked x 2 years max 4-5 cig/day  Substance and Sexual Activity  . Alcohol use: No  . Drug use: No  . Sexual activity: Never  Other Topics Concern  . 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 Difficulty of Paying Living Expenses: Not hard at all  Food Insecurity: No Food Insecurity  . Worried About Programme researcher, broadcasting/film/video in the Last Year: Never true  . Ran Out of Food in the Last Year: Never true  Transportation Needs: No Transportation  Needs  . Lack of Transportation (Medical): No  . Lack of Transportation (Non-Medical): No  Physical Activity:   . Days of Exercise per Week: Not on file  . Minutes of Exercise per Session: Not on file  Stress: No Stress Concern Present  . Feeling of Stress : Not at all  Social Connections:   . Frequency of Communication with Friends and Family: Not on file  . Frequency of Social Gatherings with Friends and Family: Not on file  . Attends Religious Services: Not on file  . Active Member of Clubs or Organizations: Not on file  . Attends Banker Meetings: Not on file  . Marital Status: Not on file    Tobacco Counseling Counseling given: Not  Answered Comment: in 30s smoked x 2 years max 4-5 cig/day   Clinical Intake:  Pre-visit preparation completed: Yes        Diabetes: No  How often do you need to have someone help you when you read instructions, pamphlets, or other written materials from your doctor or pharmacy?: 1 - Never  Interpreter Needed?: No      Activities of Daily Living In your present state of health, do you have any difficulty performing the following activities: 07/02/2020  Hearing? N  Vision? N  Difficulty concentrating or making decisions? N  Walking or climbing stairs? N  Dressing or bathing? N  Doing errands, shopping? N  Preparing Food and eating ? N  Using the Toilet? N  In the past six months, have you accidently leaked urine? N  Do you have problems with loss of bowel control? N  Managing your Medications? N  Managing your Finances? N  Housekeeping or managing your Housekeeping? N  Some recent data might be hidden    Patient Care Team: McLean-Scocuzza, Pasty Spillers, MD as PCP - General (Internal Medicine)  Indicate any recent Medical Services you may have received from other than [facility] providers in the past year (date may be approximate).     Assessment:   This is a routine wellness examination for Tamey.  I connected with Mirca today by telephone and verified that I am speaking with the correct person using two identifiers. Location patient: home Location provider: work Persons participating in the virtual visit: patient, Engineer, civil (consulting).    I discussed the limitations, risks, security and privacy concerns of performing an evaluation and management service by telephone and the availability of in person appointments. The patient expressed understanding and verbally consented to this telephonic visit.    Interactive audio and video telecommunications were attempted between this provider and patient, however failed, due to patient having technical difficulties OR patient did not have access  to video capability.  We continued and completed visit with audio only.  Some vital signs may be absent or patient reported.   Hearing/Vision screen  Hearing Screening   125Hz  250Hz  500Hz  1000Hz  2000Hz  3000Hz  4000Hz  6000Hz  8000Hz   Right ear:           Left ear:           Comments: Patient is able to hear conversational tones without difficulty.  No issues reported.  Vision Screening Comments: Cataract extraction, bilateral  Visual acuity not assessed, virtual visit.   Dietary issues and exercise activities discussed: Current Exercise Habits: Home exercise routine, Intensity: Mild  Goals    . Maintain Healthy Lifestyle     Stay active Stay hydrated Healthy diet; reduce sugar intake      Depression Screen [facility] 2/9  Scores 07/02/2020 06/18/2020 12/18/2019 07/02/2019 05/04/2019 03/28/2019 05/09/2018  PHQ - 2 Score 0 0 0 0 0 0 0    Fall Risk Fall Risk  07/02/2020 06/18/2020 12/18/2019 07/02/2019 05/04/2019  Falls in the past year? 0 0 0 0 0  Number falls in past yr: 0 0 0 - -  Injury with Fall? - 0 0 - -  Follow up Falls evaluation completed Falls evaluation completed Falls evaluation completed - -   Handrails in use when climbing stairs? Yes Home free of loose throw rugs in walkways, pet beds, electrical cords, etc? Yes  Adequate lighting in your home to reduce risk of falls? Yes   ASSISTIVE DEVICES UTILIZED TO PREVENT FALLS: Use of a cane, walker or w/c? No   TIMED UP AND GO: Was the test performed? No . Virtual visit.   Cognitive Function:  Patient is alert and oriented x3.  Denies difficulty focusing, making decisions, memory delay.  Enjoys playing computer brain challenging games.   6CIT Screen 07/02/2019 06/23/2018  What Year? 0 points 0 points  What month? 0 points 0 points  What time? 0 points 0 points  Count back from 20 0 points 0 points  Months in reverse 0 points 0 points  Repeat phrase 0 points 0 points  Total Score 0 0    Immunizations Immunization History    Administered Date(s) Administered  . PFIZER SARS-COV-2 Vaccination 10/26/2019, 11/16/2019, 05/12/2020   Health Maintenance Health Maintenance  Topic Date Due  . Fecal DNA (Cologuard)  Never done  . INFLUENZA VACCINE  12/11/2020 (Originally 04/13/2020)  . TETANUS/TDAP  06/18/2021 (Originally 01/30/1965)  . PNA vac Low Risk Adult (1 of 2 - PCV13) 06/18/2021 (Originally 01/31/2012)  . MAMMOGRAM  03/25/2022  . DEXA SCAN  Completed  . COVID-19 Vaccine  Completed  . Hepatitis C Screening  Completed   Dental Screening: Recommended annual dental exams for proper oral hygiene  Community Resource Referral / Chronic Care Management: CRR required this visit?  No   CCM required this visit?  No      Plan:    Keep all routine maintenance appointments.   Follow up 12/23/20 @ 9:30  I have personally reviewed and noted the following in the patient's chart:   . Medical and social history . Use of alcohol, tobacco or illicit drugs  . Current medications and supplements . Functional ability and status . Nutritional status . Physical activity . Advanced directives . List of other physicians . Hospitalizations, surgeries, and ER visits in previous 12 months . Vitals . Screenings to include cognitive, depression, and falls . Referrals and appointments  In addition, I have reviewed and discussed with patient certain preventive protocols, quality metrics, and best practice recommendations. A written personalized care plan for preventive services as well as general preventive health recommendations were provided to patient via mychart.     Ashok Pall, LPN   [id number]

## 2020-07-02 NOTE — Patient Instructions (Addendum)
[name] , Thank you for taking time to come for your Medicare Wellness Visit. I appreciate your ongoing commitment to your health goals. Please review the following plan we discussed and let me know if I can assist you in the future.   These are the goals we discussed: Goals    . Maintain Healthy Lifestyle     Stay active Stay hydrated Healthy diet; reduce sugar intake       This is a list of the screening recommended for you and due dates:  Health Maintenance  Topic Date Due  . Cologuard (Stool DNA test)  Never done  . Flu Shot  12/11/2020*  . Tetanus Vaccine  06/18/2021*  . Pneumonia vaccines (1 of 2 - PCV13) 06/18/2021*  . Mammogram  03/25/2022  . DEXA scan (bone density measurement)  Completed  . COVID-19 Vaccine  Completed  .  Hepatitis C: One time screening is recommended by Center for Disease Control  (CDC) for  adults born from [birth year] through 1964.   Completed  *Topic was postponed. The date shown is not the original due date.    Immunizations Immunization History  Administered Date(s) Administered  . PFIZER SARS-COV-2 Vaccination 10/26/2019, 11/16/2019, 05/12/2020   Advanced directives: mailed per request  Conditions/risks identified: none new  Follow up in one year for your annual wellness visit.   Preventive Care 21 Years and Older, Female Preventive care refers to lifestyle choices and visits with your health care provider that can promote health and wellness. What does preventive care include?  A yearly physical exam. This is also called an annual well check.  Dental exams once or twice a year.  Routine eye exams. Ask your health care provider how often you should have your eyes checked.  Personal lifestyle choices, including:  Daily care of your teeth and gums.  Regular physical activity.  Eating a healthy diet.  Avoiding tobacco and drug use.  Limiting alcohol use.  Practicing safe sex.  Taking low-dose aspirin every day.  Taking  vitamin and mineral supplements as recommended by your health care provider. What happens during an annual well check? The services and screenings done by your health care provider during your annual well check will depend on your age, overall health, lifestyle risk factors, and family history of disease. Counseling  Your health care provider may ask you questions about your:  Alcohol use.  Tobacco use.  Drug use.  Emotional well-being.  Home and relationship well-being.  Sexual activity.  Eating habits.  History of falls.  Memory and ability to understand (cognition).  Work and work Astronomer.  Reproductive health. Screening  You may have the following tests or measurements:  Height, weight, and BMI.  Blood pressure.  Lipid and cholesterol levels. These may be checked every 5 years, or more frequently if you are over 64 years old.  Skin check.  Lung cancer screening. You may have this screening every year starting at age 20 if you have a 30-pack-year history of smoking and currently smoke or have quit within the past 15 years.  Fecal occult blood test (FOBT) of the stool. You may have this test every year starting at age 91.  Flexible sigmoidoscopy or colonoscopy. You may have a sigmoidoscopy every 5 years or a colonoscopy every 10 years starting at age 82.  Hepatitis C blood test.  Hepatitis B blood test.  Sexually transmitted disease (STD) testing.  Diabetes screening. This is done by checking your blood sugar (glucose) after you have  not eaten for a while (fasting). You may have this done every 1-3 years.  Bone density scan. This is done to screen for osteoporosis. You may have this done starting at age 68.  Mammogram. This may be done every 1-2 years. Talk to your health care provider about how often you should have regular mammograms. Talk with your health care provider about your test results, treatment options, and if necessary, the need for more  tests. Vaccines  Your health care provider may recommend certain vaccines, such as:  Influenza vaccine. This is recommended every year.  Tetanus, diphtheria, and acellular pertussis (Tdap, Td) vaccine. You may need a Td booster every 10 years.  Zoster vaccine. You may need this after age 49.  Pneumococcal 13-valent conjugate (PCV13) vaccine. One dose is recommended after age 50.  Pneumococcal polysaccharide (PPSV23) vaccine. One dose is recommended after age 74. Talk to your health care provider about which screenings and vaccines you need and how often you need them. This information is not intended to replace advice given to you by your health care provider. Make sure you discuss any questions you have with your health care provider. Document Released: 09/26/2015 Document Revised: 05/19/2016 Document Reviewed: 07/01/2015 Elsevier Interactive Patient Education  2017 [employer].  Fall Prevention in the Home Falls can cause injuries. They can happen to people of all ages. There are many things you can do to make your home safe and to help prevent falls. What can I do on the outside of my home?  Regularly fix the edges of walkways and driveways and fix any cracks.  Remove anything that might make you trip as you walk through a door, such as a raised step or threshold.  Trim any bushes or trees on the path to your home.  Use bright outdoor lighting.  Clear any walking paths of anything that might make someone trip, such as rocks or tools.  Regularly check to see if handrails are loose or broken. Make sure that both sides of any steps have handrails.  Any raised decks and porches should have guardrails on the edges.  Have any leaves, snow, or ice cleared regularly.  Use sand or salt on walking paths during winter.  Clean up any spills in your garage right away. This includes oil or grease spills. What can I do in the bathroom?  Use night lights.  Install grab bars by the  toilet and in the tub and shower. Do not use towel bars as grab bars.  Use non-skid mats or decals in the tub or shower.  If you need to sit down in the shower, use a plastic, non-slip stool.  Keep the floor dry. Clean up any water that spills on the floor as soon as it happens.  Remove soap buildup in the tub or shower regularly.  Attach bath mats securely with double-sided non-slip rug tape.  Do not have throw rugs and other things on the floor that can make you trip. What can I do in the bedroom?  Use night lights.  Make sure that you have a light by your bed that is easy to reach.  Do not use any sheets or blankets that are too big for your bed. They should not hang down onto the floor.  Have a firm chair that has side arms. You can use this for support while you get dressed.  Do not have throw rugs and other things on the floor that can make you trip. What can  I do in the kitchen?  Clean up any spills right away.  Avoid walking on wet floors.  Keep items that you use a lot in easy-to-reach places.  If you need to reach something above you, use a strong step stool that has a grab bar.  Keep electrical cords out of the way.  Do not use floor polish or wax that makes floors slippery. If you must use wax, use non-skid floor wax.  Do not have throw rugs and other things on the floor that can make you trip. What can I do with my stairs?  Do not leave any items on the stairs.  Make sure that there are handrails on both sides of the stairs and use them. Fix handrails that are broken or loose. Make sure that handrails are as long as the stairways.  Check any carpeting to make sure that it is firmly attached to the stairs. Fix any carpet that is loose or worn.  Avoid having throw rugs at the top or bottom of the stairs. If you do have throw rugs, attach them to the floor with carpet tape.  Make sure that you have a light switch at the top of the stairs and the bottom of  the stairs. If you do not have them, ask someone to add them for you. What else can I do to help prevent falls?  Wear shoes that:  Do not have high heels.  Have rubber bottoms.  Are comfortable and fit you well.  Are closed at the toe. Do not wear sandals.  If you use a stepladder:  Make sure that it is fully opened. Do not climb a closed stepladder.  Make sure that both sides of the stepladder are locked into place.  Ask someone to hold it for you, if possible.  Clearly mark and make sure that you can see:  Any grab bars or handrails.  First and last steps.  Where the edge of each step is.  Use tools that help you move around (mobility aids) if they are needed. These include:  Canes.  Walkers.  Scooters.  Crutches.  Turn on the lights when you go into a dark area. Replace any light bulbs as soon as they burn out.  Set up your furniture so you have a clear path. Avoid moving your furniture around.  If any of your floors are uneven, fix them.  If there are any pets around you, be aware of where they are.  Review your medicines with your doctor. Some medicines can make you feel dizzy. This can increase your chance of falling. Ask your doctor what other things that you can do to help prevent falls. This information is not intended to replace advice given to you by your health care provider. Make sure you discuss any questions you have with your health care provider. Document Released: 06/26/2009 Document Revised: 02/05/2016 Document Reviewed: 10/04/2014 Elsevier Interactive Patient Education  2017 [employer].

## 2020-07-02 NOTE — Addendum Note (Signed)
Addended by: Warden Fillers on: 07/02/2020 10:19 AM   Modules accepted: Orders

## 2020-07-03 LAB — URINALYSIS, ROUTINE W REFLEX MICROSCOPIC
Bilirubin Urine: NEGATIVE
Glucose, UA: NEGATIVE
Hgb urine dipstick: NEGATIVE
Ketones, ur: NEGATIVE
Leukocytes,Ua: NEGATIVE
Nitrite: NEGATIVE
Protein, ur: NEGATIVE
Specific Gravity, Urine: 1.021 (ref 1.001–1.03)
pH: 6.5 (ref 5.0–8.0)

## 2020-07-04 NOTE — Telephone Encounter (Signed)
Patient informed and verbalized understanding.  States she will continue to pay out of pocket.

## 2020-12-23 ENCOUNTER — Ambulatory Visit: Payer: Medicare HMO | Admitting: Internal Medicine

## 2021-01-01 ENCOUNTER — Ambulatory Visit (INDEPENDENT_AMBULATORY_CARE_PROVIDER_SITE_OTHER): Payer: Medicare HMO | Admitting: Internal Medicine

## 2021-01-01 ENCOUNTER — Other Ambulatory Visit: Payer: Self-pay

## 2021-01-01 ENCOUNTER — Encounter: Payer: Self-pay | Admitting: Internal Medicine

## 2021-01-01 ENCOUNTER — Ambulatory Visit (INDEPENDENT_AMBULATORY_CARE_PROVIDER_SITE_OTHER): Payer: Medicare HMO

## 2021-01-01 VITALS — BP 136/78 | HR 92 | Temp 97.9°F | Ht 67.0 in | Wt 215.8 lb

## 2021-01-01 DIAGNOSIS — M25552 Pain in left hip: Secondary | ICD-10-CM | POA: Diagnosis not present

## 2021-01-01 DIAGNOSIS — I10 Essential (primary) hypertension: Secondary | ICD-10-CM | POA: Diagnosis not present

## 2021-01-01 DIAGNOSIS — Z1231 Encounter for screening mammogram for malignant neoplasm of breast: Secondary | ICD-10-CM | POA: Diagnosis not present

## 2021-01-01 DIAGNOSIS — Z0000 Encounter for general adult medical examination without abnormal findings: Secondary | ICD-10-CM | POA: Diagnosis not present

## 2021-01-01 DIAGNOSIS — M25551 Pain in right hip: Secondary | ICD-10-CM

## 2021-01-01 DIAGNOSIS — M47816 Spondylosis without myelopathy or radiculopathy, lumbar region: Secondary | ICD-10-CM

## 2021-01-01 DIAGNOSIS — M1610 Unilateral primary osteoarthritis, unspecified hip: Secondary | ICD-10-CM

## 2021-01-01 DIAGNOSIS — R748 Abnormal levels of other serum enzymes: Secondary | ICD-10-CM

## 2021-01-01 DIAGNOSIS — R17 Unspecified jaundice: Secondary | ICD-10-CM

## 2021-01-01 DIAGNOSIS — M160 Bilateral primary osteoarthritis of hip: Secondary | ICD-10-CM | POA: Diagnosis not present

## 2021-01-01 DIAGNOSIS — R7303 Prediabetes: Secondary | ICD-10-CM | POA: Diagnosis not present

## 2021-01-01 DIAGNOSIS — E785 Hyperlipidemia, unspecified: Secondary | ICD-10-CM | POA: Diagnosis not present

## 2021-01-01 MED ORDER — TELMISARTAN-HCTZ 80-25 MG PO TABS: 1.0000 | ORAL_TABLET | Freq: Every day | ORAL | 3 refills | Status: DC

## 2021-01-01 MED ORDER — PRAVASTATIN SODIUM 20 MG PO TABS: 20.0000 mg | ORAL_TABLET | Freq: Every day | ORAL | 3 refills | Status: DC

## 2021-01-01 NOTE — Patient Instructions (Addendum)
Premier protein shake for your sister  voltaren gel to your heel Ice  Dr. Idelle Jo inserts  Dr. Gala Lewandowsky if heel pain continues     Plantar Fasciitis Rehab Ask your health care provider which exercises are safe for you. Do exercises exactly as told by your health care provider and adjust them as directed. It is normal to feel mild stretching, pulling, tightness, or discomfort as you do these exercises. Stop right away if you feel sudden pain or your pain gets worse. Do not begin these exercises until told by your health care provider. Stretching and range-of-motion exercises These exercises warm up your muscles and joints and improve the movement and flexibility of your foot. These exercises also help to relieve pain. Plantar fascia stretch 1. Sit with your left / right leg crossed over your opposite knee. 2. Hold your heel with one hand with that thumb near your arch. With your other hand, hold your toes and gently pull them back toward the top of your foot. You should feel a stretch on the base (bottom) of your toes, or the bottom of your foot (plantar fascia), or both. 3. Hold this stretch for__________ seconds. 4. Slowly release your toes and return to the starting position. Repeat __________ times. Complete this exercise __________ times a day.   Gastrocnemius stretch, standing This exercise is also called a calf (gastroc) stretch. It stretches the muscles in the back of the upper calf. 1. Stand with your hands against a wall. 2. Extend your left / right leg behind you, and bend your front knee slightly. 3. Keeping your heels on the floor, your toes facing forward, and your back knee straight, shift your weight toward the wall. Do not arch your back. You should feel a gentle stretch in your upper calf. 4. Hold this position for __________ seconds. Repeat __________ times. Complete this exercise __________ times a day.   Soleus stretch, standing This exercise is also called a calf  (soleus) stretch. It stretches the muscles in the back of the lower calf. 1. Stand with your hands against a wall. 2. Extend your left / right leg behind you, and bend your front knee slightly. 3. Keeping your heels on the floor and your toes facing forward, bend your back knee and shift your weight slightly over your back leg. You should feel a gentle stretch deep in your lower calf. 4. Hold this position for __________ seconds. Repeat __________ times. Complete this exercise __________ times a day. Gastroc and soleus stretch, standing step This exercise stretches the muscles in the back of the lower leg. These muscles are in the upper calf (gastrocnemius) and the lower calf (soleus). 1. Stand with the ball of your left / right foot on the front of a step. The ball of your foot is on the walking surface, right under your toes. 2. Keep your other foot firmly on the same step. 3. Hold on to the wall or a railing for balance. 4. Slowly lift your other foot, allowing your body weight to press your heel down over the edge of the front of the step. Keep knee straight and unbent. You should feel a stretch in your calf. 5. Hold this position for __________ seconds. 6. Return both feet to the step. 7. Repeat this exercise with a slight bend in your left / right knee. Repeat __________ times with your left / right knee straight and __________ times with your left / right knee bent. Complete this exercise __________ times  a day. Balance exercise This exercise builds your balance and strength control of your arch to help take pressure off your plantar fascia. Single leg stand If this exercise is too easy, you can try it with your eyes closed or while standing on a pillow. 1. Without shoes, stand near a railing or in a doorway. You may hold on to the railing or door frame as needed. 2. Stand on your left / right foot. Keep your big toe down on the floor and lift the arch of your foot. You should feel a  stretch across the bottom of your foot and your arch. Do not let your foot roll inward. 3. Hold this position for __________ seconds. Repeat __________ times. Complete this exercise __________ times a day. This information is not intended to replace advice given to you by your health care provider. Make sure you discuss any questions you have with your health care provider. Document Revised: 06/12/2020 Document Reviewed: 06/12/2020 [employer] Patient Education  2021 [employer]  Plantar Fasciitis  Plantar fasciitis is a painful foot condition that affects the heel. It occurs when the band of tissue that connects the toes to the heel bone (plantar fascia) becomes irritated. This can happen as the result of exercising too much or doing other repetitive activities (overuse injury). Plantar fasciitis can cause mild irritation to severe pain that makes it difficult to walk or move. The pain is usually worse in the morning after sleeping, or after sitting or lying down for a period of time. Pain may also be worse after long periods of walking or standing. What are the causes? This condition may be caused by:  Standing for long periods of time.  Wearing shoes that do not have good arch support.  Doing activities that put stress on joints (high-impact activities). This includes ballet and exercise that makes your heart beat faster (aerobic exercise), such as running.  Being overweight.  An abnormal way of walking (gait).  Tight muscles in the back of your lower leg (calf).  High arches in your feet or flat feet.  Starting a new athletic activity. What are the signs or symptoms? The main symptom of this condition is heel pain. Pain may get worse after the following:  Taking the first steps after a time of rest, especially in the morning after awakening, or after you have been sitting or lying down for a while.  Long periods of standing still. Pain may decrease after 30-45 minutes of  activity, such as gentle walking. How is this diagnosed? This condition may be diagnosed based on your medical history, a physical exam, and your symptoms. Your health care provider will check for:  A tender area on the bottom of your foot.  A high arch in your foot or flat feet.  Pain when you move your foot.  Difficulty moving your foot. You may have imaging tests to confirm the diagnosis, such as:  X-rays.  Ultrasound.  MRI. How is this treated? Treatment for plantar fasciitis depends on how severe your condition is. Treatment may include:  Rest, ice, pressure (compression), and raising (elevating) the affected foot. This is called RICE therapy. Your health care provider may recommend RICE therapy along with over-the-counter pain medicines to manage your pain.  Exercises to stretch your calves and your plantar fascia.  A splint that holds your foot in a stretched, upward position while you sleep (night splint).  Physical therapy to relieve symptoms and prevent problems in the future.  Injections  of steroid medicine (cortisone) to relieve pain and inflammation.  Stimulating your plantar fascia with electrical impulses (extracorporeal shock wave therapy). This is usually the last treatment option before surgery.  Surgery, if other treatments have not worked after 12 months. Follow these instructions at home: Managing pain, stiffness, and swelling  If directed, put ice on the painful area. To do this: ? Put ice in a plastic bag, or use a frozen bottle of water. ? Place a towel between your skin and the bag or bottle. ? Roll the bottom of your foot over the bag or bottle. ? Do this for 20 minutes, 2-3 times a day.  Wear athletic shoes that have air-sole or gel-sole cushions, or try soft shoe inserts that are designed for plantar fasciitis.  Elevate your foot above the level of your heart while you are sitting or lying down.   Activity  Avoid activities that cause pain.  Ask your health care provider what activities are safe for you.  Do physical therapy exercises and stretches as told by your health care provider.  Try activities and forms of exercise that are easier on your joints (low impact). Examples include swimming, water aerobics, and biking. General instructions  Take over-the-counter and prescription medicines only as told by your health care provider.  Wear a night splint while sleeping, if told by your health care provider. Loosen the splint if your toes tingle, become numb, or turn cold and blue.  Maintain a healthy weight, or work with your health care provider to lose weight as needed.  Keep all follow-up visits. This is important. Contact a health care provider if you have:  Symptoms that do not go away with home treatment.  Pain that gets worse.  Pain that affects your ability to move or do daily activities. Summary  Plantar fasciitis is a painful foot condition that affects the heel. It occurs when the band of tissue that connects the toes to the heel bone (plantar fascia) becomes irritated.  Heel pain is the main symptom of this condition. It may get worse after exercising too much or standing still for a long time.  Treatment varies, but it usually starts with rest, ice, pressure (compression), and raising (elevating) the affected foot. This is called RICE therapy. Over-the-counter medicines can also be used to manage pain. This information is not intended to replace advice given to you by your health care provider. Make sure you discuss any questions you have with your health care provider. Document Revised: 12/17/2019 Document Reviewed: 12/17/2019 [employer] Patient Education  2021 [employer]   Low Back Sprain or Strain Rehab Ask your health care provider which exercises are safe for you. Do exercises exactly as told by your health care provider and adjust them as directed. It is normal to feel mild stretching, pulling,  tightness, or discomfort as you do these exercises. Stop right away if you feel sudden pain or your pain gets worse. Do not begin these exercises until told by your health care provider. Stretching and range-of-motion exercises These exercises warm up your muscles and joints and improve the movement and flexibility of your back. These exercises also help to relieve pain, numbness, and tingling. Lumbar rotation 1. Lie on your back on a firm surface and bend your knees. 2. Straighten your arms out to your sides so each arm forms a 90-degree angle (right angle) with a side of your body. 3. Slowly move (rotate) both of your knees to one side of your body  until you feel a stretch in your lower back (lumbar). Try not to let your shoulders lift off the floor. 4. Hold this position for __________ seconds. 5. Tense your abdominal muscles and slowly move your knees back to the starting position. 6. Repeat this exercise on the other side of your body. Repeat __________ times. Complete this exercise __________ times a day.   Single knee to chest 1. Lie on your back on a firm surface with both legs straight. 2. Bend one of your knees. Use your hands to move your knee up toward your chest until you feel a gentle stretch in your lower back and buttock. ? Hold your leg in this position by holding on to the front of your knee. ? Keep your other leg as straight as possible. 3. Hold this position for __________ seconds. 4. Slowly return to the starting position. 5. Repeat with your other leg. Repeat __________ times. Complete this exercise __________ times a day.   Prone extension on elbows 1. Lie on your abdomen on a firm surface (prone position). 2. Prop yourself up on your elbows. 3. Use your arms to help lift your chest up until you feel a gentle stretch in your abdomen and your lower back. ? This will place some of your body weight on your elbows. If this is uncomfortable, try stacking pillows under your  chest. ? Your hips should stay down, against the surface that you are lying on. Keep your hip and back muscles relaxed. 4. Hold this position for __________ seconds. 5. Slowly relax your upper body and return to the starting position. Repeat __________ times. Complete this exercise __________ times a day.   Strengthening exercises These exercises build strength and endurance in your back. Endurance is the ability to use your muscles for a long time, even after they get tired. Pelvic tilt This exercise strengthens the muscles that lie deep in the abdomen. 1. Lie on your back on a firm surface. Bend your knees and keep your feet flat on the floor. 2. Tense your abdominal muscles. Tip your pelvis up toward the ceiling and flatten your lower back into the floor. ? To help with this exercise, you may place a small towel under your lower back and try to push your back into the towel. 3. Hold this position for __________ seconds. 4. Let your muscles relax completely before you repeat this exercise. Repeat __________ times. Complete this exercise __________ times a day. Alternating arm and leg raises 1. Get on your hands and knees on a firm surface. If you are on a hard floor, you may want to use padding, such as an exercise mat, to cushion your knees. 2. Line up your arms and legs. Your hands should be directly below your shoulders, and your knees should be directly below your hips. 3. Lift your left leg behind you. At the same time, raise your right arm and straighten it in front of you. ? Do not lift your leg higher than your hip. ? Do not lift your arm higher than your shoulder. ? Keep your abdominal and back muscles tight. ? Keep your hips facing the ground. ? Do not arch your back. ? Keep your balance carefully, and do not hold your breath. 4. Hold this position for __________ seconds. 5. Slowly return to the starting position. 6. Repeat with your right leg and your left arm. Repeat  __________ times. Complete this exercise __________ times a day.   Abdominal set with straight leg  raise 1. Lie on your back on a firm surface. 2. Bend one of your knees and keep your other leg straight. 3. Tense your abdominal muscles and lift your straight leg up, 4-6 inches (10-15 cm) off the ground. 4. Keep your abdominal muscles tight and hold this position for __________ seconds. ? Do not hold your breath. ? Do not arch your back. Keep it flat against the ground. 5. Keep your abdominal muscles tense as you slowly lower your leg back to the starting position. 6. Repeat with your other leg. Repeat __________ times. Complete this exercise __________ times a day.   Single leg lower with bent knees 1. Lie on your back on a firm surface. 2. Tense your abdominal muscles and lift your feet off the floor, one foot at a time, so your knees and hips are bent in 90-degree angles (right angles). ? Your knees should be over your hips and your lower legs should be parallel to the floor. 3. Keeping your abdominal muscles tense and your knee bent, slowly lower one of your legs so your toe touches the ground. 4. Lift your leg back up to return to the starting position. ? Do not hold your breath. ? Do not let your back arch. Keep your back flat against the ground. 5. Repeat with your other leg. Repeat __________ times. Complete this exercise __________ times a day. Posture and body mechanics Good posture and healthy body mechanics can help to relieve stress in your body's tissues and joints. Body mechanics refers to the movements and positions of your body while you do your daily activities. Posture is part of body mechanics. Good posture means:  Your spine is in its natural S-curve position (neutral).  Your shoulders are pulled back slightly.  Your head is not tipped forward. Follow these guidelines to improve your posture and body mechanics in your everyday activities. Standing  When standing,  keep your spine neutral and your feet about hip width apart. Keep a slight bend in your knees. Your ears, shoulders, and hips should line up.  When you do a task in which you stand in one place for a long time, place one foot up on a stable object that is 2-4 inches (5-10 cm) high, such as a footstool. This helps keep your spine neutral.   Sitting  When sitting, keep your spine neutral and keep your feet flat on the floor. Use a footrest, if necessary, and keep your thighs parallel to the floor. Avoid rounding your shoulders, and avoid tilting your head forward.  When working at a desk or a computer, keep your desk at a height where your hands are slightly lower than your elbows. Slide your chair under your desk so you are close enough to maintain good posture.  When working at a computer, place your monitor at a height where you are looking straight ahead and you do not have to tilt your head forward or downward to look at the screen.   Resting  When lying down and resting, avoid positions that are most painful for you.  If you have pain with activities such as sitting, bending, stooping, or squatting, lie in a position in which your body does not bend very much. For example, avoid curling up on your side with your arms and knees near your chest (fetal position).  If you have pain with activities such as standing for a long time or reaching with your arms, lie with your spine in a neutral position  and bend your knees slightly. Try the following positions: ? Lying on your side with a pillow between your knees. ? Lying on your back with a pillow under your knees. Lifting  When lifting objects, keep your feet at least shoulder width apart and tighten your abdominal muscles.  Bend your knees and hips and keep your spine neutral. It is important to lift using the strength of your legs, not your back. Do not lock your knees straight out.  Always ask for help to lift heavy or awkward objects.    This information is not intended to replace advice given to you by your health care provider. Make sure you discuss any questions you have with your health care provider. Document Revised: 12/22/2018 Document Reviewed: 09/21/2018 [employer] Patient Education  2021 [employer]  Back Exercises The following exercises strengthen the muscles that help to support the trunk and back. They also help to keep the lower back flexible. Doing these exercises can help to prevent back pain or lessen existing pain.  If you have back pain or discomfort, try doing these exercises 2-3 times each day or as told by your health care provider.  As your pain improves, do them once each day, but increase the number of times that you repeat the steps for each exercise (do more repetitions).  To prevent the recurrence of back pain, continue to do these exercises once each day or as told by your health care provider. Do exercises exactly as told by your health care provider and adjust them as directed. It is normal to feel mild stretching, pulling, tightness, or discomfort as you do these exercises, but you should stop right away if you feel sudden pain or your pain gets worse. Exercises Single knee to chest Repeat these steps 3-5 times for each leg: 5. Lie on your back on a firm bed or the floor with your legs extended. 6. Bring one knee to your chest. Your other leg should stay extended and in contact with the floor. 7. Hold your knee in place by grabbing your knee or thigh with both hands and hold. 8. Pull on your knee until you feel a gentle stretch in your lower back or buttocks. 9. Hold the stretch for 10-30 seconds. 10. Slowly release and straighten your leg. Pelvic tilt Repeat these steps 5-10 times: 5. Lie on your back on a firm bed or the floor with your legs extended. 6. Bend your knees so they are pointing toward the ceiling and your feet are flat on the floor. 7. Tighten your lower abdominal muscles  to press your lower back against the floor. This motion will tilt your pelvis so your tailbone points up toward the ceiling instead of pointing to your feet or the floor. 8. With gentle tension and even breathing, hold this position for 5-10 seconds. Cat-cow Repeat these steps until your lower back becomes more flexible: 8. Get into a hands-and-knees position on a firm surface. Keep your hands under your shoulders, and keep your knees under your hips. You may place padding under your knees for comfort. 9. Let your head hang down toward your chest. Contract your abdominal muscles and point your tailbone toward the floor so your lower back becomes rounded like the back of a cat. 10. Hold this position for 5 seconds. 11. Slowly lift your head, let your abdominal muscles relax and point your tailbone up toward the ceiling so your back forms a sagging arch like the back of a cow.  12. Hold this position for 5 seconds.   Press-ups Repeat these steps 5-10 times: 4. Lie on your abdomen (face-down) on the floor. 5. Place your palms near your head, about shoulder-width apart. 6. Keeping your back as relaxed as possible and keeping your hips on the floor, slowly straighten your arms to raise the top half of your body and lift your shoulders. Do not use your back muscles to raise your upper torso. You may adjust the placement of your hands to make yourself more comfortable. 7. Hold this position for 5 seconds while you keep your back relaxed. 8. Slowly return to lying flat on the floor.   Bridges Repeat these steps 10 times: 1. Lie on your back on a firm surface. 2. Bend your knees so they are pointing toward the ceiling and your feet are flat on the floor. Your arms should be flat at your sides, next to your body. 3. Tighten your buttocks muscles and lift your buttocks off the floor until your waist is at almost the same height as your knees. You should feel the muscles working in your buttocks and the back  of your thighs. If you do not feel these muscles, slide your feet 1-2 inches farther away from your buttocks. 4. Hold this position for 3-5 seconds. 5. Slowly lower your hips to the starting position, and allow your buttocks muscles to relax completely. If this exercise is too easy, try doing it with your arms crossed over your chest.   Abdominal crunches Repeat these steps 5-10 times: 1. Lie on your back on a firm bed or the floor with your legs extended. 2. Bend your knees so they are pointing toward the ceiling and your feet are flat on the floor. 3. Cross your arms over your chest. 4. Tip your chin slightly toward your chest without bending your neck. 5. Tighten your abdominal muscles and slowly raise your trunk (torso) high enough to lift your shoulder blades a tiny bit off the floor. Avoid raising your torso higher than that because it can put too much stress on your low back and does not help to strengthen your abdominal muscles. 6. Slowly return to your starting position. Back lifts Repeat these steps 5-10 times: 1. Lie on your abdomen (face-down) with your arms at your sides, and rest your forehead on the floor. 2. Tighten the muscles in your legs and your buttocks. 3. Slowly lift your chest off the floor while you keep your hips pressed to the floor. Keep the back of your head in line with the curve in your back. Your eyes should be looking at the floor. 4. Hold this position for 3-5 seconds. 5. Slowly return to your starting position. Contact a health care provider if:  Your back pain or discomfort gets much worse when you do an exercise.  Your worsening back pain or discomfort does not lessen within 2 hours after you exercise. If you have any of these problems, stop doing these exercises right away. Do not do them again unless your health care provider says that you can. Get help right away if:  You develop sudden, severe back pain. If this happens, stop doing the exercises  right away. Do not do them again unless your health care provider says that you can. This information is not intended to replace advice given to you by your health care provider. Make sure you discuss any questions you have with your health care provider. Document Revised: 01/04/2019 Document Reviewed: 06/01/2018  [employer] Patient Education  2021 [employer]  

## 2021-01-01 NOTE — Progress Notes (Signed)
Chief Complaint  Patient presents with  . Follow-up   Annual 1. C/o intermittent low back pain and hip pain at times stiffness dexa commented on arthritis in low back will do Xrays today L>r hip pain mild to moderate at times  2. Pt agreeable to cologuard  3. C/o heel pain h/o PF est with podiatry due to f/u  4. htn controlled for age on micardis 80-25 mg qd. hld controlled pravastatin 20 mg qhs   Review of Systems  Constitutional: Negative for weight loss.  HENT: Negative for hearing loss.   Eyes: Negative for blurred vision.  Respiratory: Negative for shortness of breath.   Cardiovascular: Negative for chest pain.  Gastrointestinal: Negative for abdominal pain.  Musculoskeletal: Positive for back pain and joint pain.  Skin: Negative for rash.  Neurological: Negative for headaches.  Psychiatric/Behavioral: Negative for memory loss.   Past Medical History:  Diagnosis Date  . Blood transfusion without reported diagnosis   . Cataract    ? which eye wears glasses   . Hyperlipidemia   . Hypertension   . Prediabetes    Past Surgical History:  Procedure Laterality Date  . ABDOMINAL HYSTERECTOMY     over 30 years fibroids no h/o abnormal pap    Family History  Problem Relation Age of Onset  . Arthritis Mother   . Diabetes Mother   . Hypertension Mother   . Alcohol abuse Father   . Arthritis Father   . Dementia Father   . Stroke Sister   . Congestive Heart Failure Sister   . Other Sister        09/13/19 covid 19+  . Leukemia Sister        ? type dx 49   Social History   Socioeconomic History  . Marital status: Single    Spouse name: Not on file  . Number of children: 0  . Years of education: Not on file  . Highest education level: Not on file  Occupational History  . Not on file  Tobacco Use  . Smoking status: Former Games developer  . Smokeless tobacco: Never Used  . Tobacco comment: in 30s smoked x 2 years max 4-5 cig/day  Substance and Sexual Activity  . Alcohol  use: No  . Drug use: No  . Sexual activity: Never  Other Topics Concern  . 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 Difficulty of Paying Living Expenses: Not hard at all  Food Insecurity: No Food Insecurity  . Worried About Programme researcher, broadcasting/film/video in the Last Year: Never true  . Ran Out of Food in the Last Year: Never true  Transportation Needs: No Transportation Needs  . Lack of Transportation (Medical): No  . Lack of Transportation (Non-Medical): No  Physical Activity: Not on file  Stress: No Stress Concern Present  . Feeling of Stress : Not at all  Social Connections: Not on file  Intimate Partner Violence: Not on file   Current Meds  Medication Sig  . Cholecalciferol (VITAMIN D3) 1000 units CAPS Take by mouth.  . cyanocobalamin 1000 MCG tablet Take 1,000 mcg by mouth daily.  . Multiple Vitamins-Minerals (MULTIVITAMIN WOMEN) TABS Take by mouth daily.  . Turmeric (  QC TUMERIC COMPLEX PO) Take by mouth.  . [DISCONTINUED] pravastatin (PRAVACHOL) 20 MG tablet Take 1 tablet (20 mg total) by mouth at bedtime.  . [DISCONTINUED] telmisartan-hydrochlorothiazide (MICARDIS HCT) 80-25 MG tablet Take 1 tablet by mouth daily. In am   Allergies  Allergen Reactions  . Lipitor [Atorvastatin Calcium]     feet, hands, ankles swelling per pt    . Penicillins     Vaginal infections   No results found for this or any previous visit (from the past 2160 hour(s)). Objective  Body mass index is 33.8 kg/m. Wt Readings from Last 3 Encounters:  01/01/21 215 lb 12.8 oz (97.9 kg)  07/02/20 215 lb (97.5 kg)  06/18/20 215 lb 3.2 oz (97.6 kg)   Temp Readings from Last 3 Encounters:  01/01/21 97.9 F (36.6 C)  06/18/20 97.8 F (36.6 C)  12/18/19 97.7 F (36.5 C)  (Temporal)   BP Readings from Last 3 Encounters:  01/01/21 136/78  07/02/20 117/75  06/18/20 134/90   Pulse Readings from Last 3 Encounters:  01/01/21 92  07/02/20 70  06/18/20 73    Physical Exam Vitals and nursing note reviewed.  Constitutional:      Appearance: Normal appearance. She is well-developed and well-groomed. She is obese.  HENT:     Head: Normocephalic and atraumatic.  Cardiovascular:     Rate and Rhythm: Normal rate and regular rhythm.     Heart sounds: Normal heart sounds. No murmur heard.   Pulmonary:     Effort: Pulmonary effort is normal.     Breath sounds: Normal breath sounds.  Abdominal:     General: Abdomen is flat. Bowel sounds are normal.     Tenderness: There is no abdominal tenderness.  Skin:    General: Skin is warm and dry.  Neurological:     General: No focal deficit present.     Mental Status: She is alert and oriented to person, place, and time. Mental status is at baseline.     Gait: Gait normal.  Psychiatric:        Attention and Perception: Attention and perception normal.        Mood and Affect: Mood and affect normal.        Speech: Speech normal.        Behavior: Behavior normal. Behavior is cooperative.        Thought Content: Thought content normal.        Cognition and Memory: Cognition and memory normal.        Judgment: Judgment normal.     Assessment  Plan  Annual physical exam Declines flu shotand other vaccines (Tdap, prevnar, pna 23, Tdap, shingrix) Hep c neg Pfizer 3/3 consider 4th  Hascologaurdnot done yet -reordered today plans to do as of 01/01/21  Out of pap window s/p hysterectomy no h/o abnormal pap  mammo7/13/2021negativedeclines breast examprevordered 03/2021   DEXA 7/13/21steopenia rec Calcium 600 mg bid and vit D3 1000-2000  IU qd. rec repeat in 3-5 years.   Former smoker in 30s x 2 years max 4-5 cig per day.  H/o cataractsstablereferred to [place] [facility] Dr. Dorcas Mcmurray f/u per  eye md recs  rec healthy diet and exercise   Essential hypertension - Plan: telmisartan-hydrochlorothiazide (MICARDIS HCT) 80-25 MG tablet Fasting labs upcoming cmet,cbc, a1c, lipid  Hyperlipidemia, unspecified hyperlipidemia type - Plan: pravastatin (PRAVACHOL) 20 MG tablet,   Arthritis of lumbar spine - Plan: DG Lumbar Spine Complete 01/01/21 Xray  IMPRESSION: Mild multilevel degenerate endplate changes with  moderate lower lumbar predominant facet arthritis.  Bilateral hip pain - Plan: 01/01/21 DG Hip Unilat W OR W/O Pelvis 2-3 Views Left, DG Hip Unilat W OR W/O Pelvis 2-3 Views Right  FINDINGS: There is no evidence of left hip fracture. There is mild-moderate left hip degenerative change. Mild enthesophyte formation of the greater trochanter. Lower lumbar spine and bilateral SI joint degenerative change.  IMPRESSION: Mild-moderate left hip osteoarthritis FINDINGS: There is no evidence of right hip fracture. There is mild right hip degenerative change. There is mild enthesophyte formation on the greater trochanter.  IMPRESSION: Mild right hip osteoarthritis.   Provider: Dr. French Ana [facility]

## 2021-01-05 DIAGNOSIS — M1610 Unilateral primary osteoarthritis, unspecified hip: Secondary | ICD-10-CM

## 2021-01-05 DIAGNOSIS — M25551 Pain in right hip: Secondary | ICD-10-CM

## 2021-01-05 DIAGNOSIS — M25552 Pain in left hip: Secondary | ICD-10-CM | POA: Insufficient documentation

## 2021-01-05 DIAGNOSIS — M47816 Spondylosis without myelopathy or radiculopathy, lumbar region: Secondary | ICD-10-CM | POA: Insufficient documentation

## 2021-01-05 HISTORY — DX: Pain in right hip: M25.551

## 2021-01-05 HISTORY — DX: Unilateral primary osteoarthritis, unspecified hip: M16.10

## 2021-01-05 HISTORY — DX: Pain in left hip: M25.552

## 2021-01-05 NOTE — Addendum Note (Signed)
Addended by: Quentin Ore on: 01/05/2021 01:58 PM   Modules accepted: Orders

## 2021-01-06 ENCOUNTER — Other Ambulatory Visit (INDEPENDENT_AMBULATORY_CARE_PROVIDER_SITE_OTHER): Payer: Medicare HMO

## 2021-01-06 ENCOUNTER — Other Ambulatory Visit: Payer: Self-pay

## 2021-01-06 DIAGNOSIS — I10 Essential (primary) hypertension: Secondary | ICD-10-CM

## 2021-01-06 DIAGNOSIS — E785 Hyperlipidemia, unspecified: Secondary | ICD-10-CM

## 2021-01-06 DIAGNOSIS — R7303 Prediabetes: Secondary | ICD-10-CM | POA: Diagnosis not present

## 2021-01-06 LAB — CBC WITH DIFFERENTIAL/PLATELET
Basophils Absolute: 0 10*3/uL (ref 0.0–0.1)
Basophils Relative: 0.7 % (ref 0.0–3.0)
Eosinophils Absolute: 0.1 10*3/uL (ref 0.0–0.7)
Eosinophils Relative: 2.8 % (ref 0.0–5.0)
HCT: 40 % (ref 36.0–46.0)
Hemoglobin: 13.1 g/dL (ref 12.0–15.0)
Lymphocytes Relative: 35.6 % (ref 12.0–46.0)
Lymphs Abs: 1.4 10*3/uL (ref 0.7–4.0)
MCHC: 32.9 g/dL (ref 30.0–36.0)
MCV: 87.7 fl (ref 78.0–100.0)
Monocytes Absolute: 0.3 10*3/uL (ref 0.1–1.0)
Monocytes Relative: 8.8 % (ref 3.0–12.0)
Neutro Abs: 2 10*3/uL (ref 1.4–7.7)
Neutrophils Relative %: 52.1 % (ref 43.0–77.0)
Platelets: 212 10*3/uL (ref 150.0–400.0)
RBC: 4.56 Mil/uL (ref 3.87–5.11)
RDW: 14.6 % (ref 11.5–15.5)
WBC: 3.9 10*3/uL — ABNORMAL LOW (ref 4.0–10.5)

## 2021-01-06 LAB — LIPID PANEL
Cholesterol: 146 mg/dL (ref 0–200)
HDL: 50.9 mg/dL (ref >39.00)
LDL Cholesterol: 73 mg/dL (ref 0–99)
NonHDL: 94.84
Total CHOL/HDL Ratio: 3
Triglycerides: 107 mg/dL (ref 0.0–149.0)
VLDL: 21.4 mg/dL (ref 0.0–40.0)

## 2021-01-06 LAB — COMPREHENSIVE METABOLIC PANEL
ALT: 11 U/L (ref 0–35)
AST: 13 U/L (ref 0–37)
Albumin: 4.1 g/dL (ref 3.5–5.2)
Alkaline Phosphatase: 68 U/L (ref 39–117)
BUN: 13 mg/dL (ref 6–23)
CO2: 27 mEq/L (ref 19–32)
Calcium: 9.3 mg/dL (ref 8.4–10.5)
Chloride: 108 mEq/L (ref 96–112)
Creatinine, Ser: 0.94 mg/dL (ref 0.40–1.20)
GFR: 60.02 mL/min (ref >60.00)
Glucose, Bld: 99 mg/dL (ref 70–99)
Potassium: 4.1 mEq/L (ref 3.5–5.1)
Sodium: 142 mEq/L (ref 135–145)
Total Bilirubin: 1.6 mg/dL — ABNORMAL HIGH (ref 0.2–1.2)
Total Protein: 6.7 g/dL (ref 6.0–8.3)

## 2021-01-06 LAB — HEMOGLOBIN A1C: Hgb A1c MFr Bld: 6.2 % (ref 4.6–6.5)

## 2021-01-07 NOTE — Addendum Note (Signed)
Addended by: Quentin Ore on: 01/07/2021 05:12 PM   Modules accepted: Orders

## 2021-01-26 ENCOUNTER — Other Ambulatory Visit: Payer: Self-pay | Admitting: Internal Medicine

## 2021-01-26 DIAGNOSIS — E785 Hyperlipidemia, unspecified: Secondary | ICD-10-CM

## 2021-01-29 ENCOUNTER — Other Ambulatory Visit: Payer: Self-pay

## 2021-01-29 ENCOUNTER — Ambulatory Visit
Admission: RE | Admit: 2021-01-29 | Discharge: 2021-01-29 | Disposition: A | Discharge status: Home / Self Care | Payer: Medicare HMO | Source: Ambulatory Visit | Attending: Internal Medicine | Admitting: Internal Medicine

## 2021-01-29 DIAGNOSIS — R748 Abnormal levels of other serum enzymes: Secondary | ICD-10-CM | POA: Diagnosis not present

## 2021-01-29 DIAGNOSIS — R17 Unspecified jaundice: Secondary | ICD-10-CM | POA: Insufficient documentation

## 2021-01-29 DIAGNOSIS — R7989 Other specified abnormal findings of blood chemistry: Secondary | ICD-10-CM | POA: Diagnosis not present

## 2021-01-29 DIAGNOSIS — N281 Cyst of kidney, acquired: Secondary | ICD-10-CM | POA: Diagnosis not present

## 2021-02-02 ENCOUNTER — Telehealth: Payer: Self-pay | Admitting: Internal Medicine

## 2021-02-02 NOTE — Telephone Encounter (Signed)
PT called back to return missed call about [place] results.

## 2021-02-03 NOTE — Telephone Encounter (Signed)
See result notes. 

## 2021-02-06 ENCOUNTER — Telehealth: Payer: Self-pay

## 2021-02-06 ENCOUNTER — Telehealth: Payer: Self-pay | Admitting: Internal Medicine

## 2021-02-06 NOTE — Telephone Encounter (Signed)
Rejection Reason - Patient Declined" Stacy Vasquez said on Feb 05, 2021 12:54 PM  msg from [facility] orthopedic surgery

## 2021-02-06 NOTE — Telephone Encounter (Signed)
Left message to call back for lab results.

## 2021-02-06 NOTE — Telephone Encounter (Signed)
PT called back to return missed call about results

## 2021-02-11 NOTE — Telephone Encounter (Signed)
See result note.  

## 2021-03-26 ENCOUNTER — Other Ambulatory Visit: Payer: Self-pay

## 2021-03-26 ENCOUNTER — Ambulatory Visit
Admission: RE | Admit: 2021-03-26 | Discharge: 2021-03-26 | Disposition: A | Discharge status: Home / Self Care | Payer: Medicare HMO | Source: Ambulatory Visit | Attending: Internal Medicine | Admitting: Internal Medicine

## 2021-03-26 DIAGNOSIS — Z1231 Encounter for screening mammogram for malignant neoplasm of breast: Secondary | ICD-10-CM

## 2021-07-03 ENCOUNTER — Ambulatory Visit: Payer: Medicare HMO

## 2021-07-07 ENCOUNTER — Ambulatory Visit: Payer: Medicare HMO

## 2021-07-07 ENCOUNTER — Telehealth: Payer: Self-pay

## 2021-07-07 NOTE — Telephone Encounter (Signed)
Patient is calling to check on the status of her appointment due to not receiving a call.Offered to reschedule patient and patient declined.Please call her at [phone].

## 2021-07-07 NOTE — Telephone Encounter (Signed)
Unable to reach patient for scheduled AWV on preferred. Left message to reschedule.

## 2021-10-02 DIAGNOSIS — H2513 Age-related nuclear cataract, bilateral: Secondary | ICD-10-CM | POA: Diagnosis not present

## 2022-01-01 ENCOUNTER — Other Ambulatory Visit: Payer: Self-pay

## 2022-01-01 ENCOUNTER — Telehealth: Payer: Self-pay | Admitting: Internal Medicine

## 2022-01-01 DIAGNOSIS — R748 Abnormal levels of other serum enzymes: Secondary | ICD-10-CM

## 2022-01-01 DIAGNOSIS — Z1389 Encounter for screening for other disorder: Secondary | ICD-10-CM

## 2022-01-01 DIAGNOSIS — Z1329 Encounter for screening for other suspected endocrine disorder: Secondary | ICD-10-CM

## 2022-01-01 DIAGNOSIS — Z0000 Encounter for general adult medical examination without abnormal findings: Secondary | ICD-10-CM

## 2022-01-01 DIAGNOSIS — E785 Hyperlipidemia, unspecified: Secondary | ICD-10-CM

## 2022-01-01 DIAGNOSIS — I10 Essential (primary) hypertension: Secondary | ICD-10-CM

## 2022-01-01 DIAGNOSIS — R7303 Prediabetes: Secondary | ICD-10-CM

## 2022-01-01 NOTE — Telephone Encounter (Signed)
The patient has been scheduled for a physical on 01/12/22 and fasting labs on 01/08/22. Will need lab orders for fasting lab work. ?

## 2022-01-01 NOTE — Progress Notes (Signed)
Lab orders placed per pt's requesting, for fasting lab appt on 01/08/22. ?

## 2022-01-08 ENCOUNTER — Telehealth: Payer: Self-pay

## 2022-01-08 ENCOUNTER — Other Ambulatory Visit (INDEPENDENT_AMBULATORY_CARE_PROVIDER_SITE_OTHER): Payer: Medicare HMO

## 2022-01-08 ENCOUNTER — Other Ambulatory Visit: Payer: Self-pay | Admitting: Internal Medicine

## 2022-01-08 DIAGNOSIS — Z1329 Encounter for screening for other suspected endocrine disorder: Secondary | ICD-10-CM | POA: Diagnosis not present

## 2022-01-08 DIAGNOSIS — I10 Essential (primary) hypertension: Secondary | ICD-10-CM

## 2022-01-08 DIAGNOSIS — E785 Hyperlipidemia, unspecified: Secondary | ICD-10-CM

## 2022-01-08 DIAGNOSIS — N3000 Acute cystitis without hematuria: Secondary | ICD-10-CM

## 2022-01-08 DIAGNOSIS — Z0000 Encounter for general adult medical examination without abnormal findings: Secondary | ICD-10-CM

## 2022-01-08 DIAGNOSIS — R748 Abnormal levels of other serum enzymes: Secondary | ICD-10-CM | POA: Diagnosis not present

## 2022-01-08 DIAGNOSIS — R7303 Prediabetes: Secondary | ICD-10-CM

## 2022-01-08 DIAGNOSIS — Z1389 Encounter for screening for other disorder: Secondary | ICD-10-CM

## 2022-01-08 LAB — CBC WITH DIFFERENTIAL/PLATELET
Basophils Absolute: 0 10*3/uL (ref 0.0–0.1)
Basophils Relative: 0.7 % (ref 0.0–3.0)
Eosinophils Absolute: 0.1 10*3/uL (ref 0.0–0.7)
Eosinophils Relative: 3.5 % (ref 0.0–5.0)
HCT: 39.5 % (ref 36.0–46.0)
Hemoglobin: 12.6 g/dL (ref 12.0–15.0)
Lymphocytes Relative: 36.7 % (ref 12.0–46.0)
Lymphs Abs: 1.3 10*3/uL (ref 0.7–4.0)
MCHC: 32 g/dL (ref 30.0–36.0)
MCV: 89.7 fl (ref 78.0–100.0)
Monocytes Absolute: 0.3 10*3/uL (ref 0.1–1.0)
Monocytes Relative: 9.3 % (ref 3.0–12.0)
Neutro Abs: 1.8 10*3/uL (ref 1.4–7.7)
Neutrophils Relative %: 49.8 % (ref 43.0–77.0)
Platelets: 199 10*3/uL (ref 150.0–400.0)
RBC: 4.4 Mil/uL (ref 3.87–5.11)
RDW: 14.4 % (ref 11.5–15.5)
WBC: 3.5 10*3/uL — ABNORMAL LOW (ref 4.0–10.5)

## 2022-01-08 LAB — COMPREHENSIVE METABOLIC PANEL
ALT: 15 U/L (ref 0–35)
AST: 16 U/L (ref 0–37)
Albumin: 4.1 g/dL (ref 3.5–5.2)
Alkaline Phosphatase: 59 U/L (ref 39–117)
BUN: 15 mg/dL (ref 6–23)
CO2: 31 mEq/L (ref 19–32)
Calcium: 9.1 mg/dL (ref 8.4–10.5)
Chloride: 106 mEq/L (ref 96–112)
Creatinine, Ser: 0.98 mg/dL (ref 0.40–1.20)
GFR: 56.69 mL/min — ABNORMAL LOW (ref >60.00)
Glucose, Bld: 93 mg/dL (ref 70–99)
Potassium: 3.7 mEq/L (ref 3.5–5.1)
Sodium: 142 mEq/L (ref 135–145)
Total Bilirubin: 1.2 mg/dL (ref 0.2–1.2)
Total Protein: 6.6 g/dL (ref 6.0–8.3)

## 2022-01-08 LAB — URINALYSIS, ROUTINE W REFLEX MICROSCOPIC
Bilirubin Urine: NEGATIVE
Hgb urine dipstick: NEGATIVE
Ketones, ur: NEGATIVE
Nitrite: NEGATIVE
RBC / HPF: NONE SEEN (ref 0–?)
Specific Gravity, Urine: 1.01 (ref 1.000–1.030)
Total Protein, Urine: NEGATIVE
Urine Glucose: NEGATIVE
Urobilinogen, UA: 0.2 (ref 0.0–1.0)
pH: 7.5 (ref 5.0–8.0)

## 2022-01-08 LAB — LIPID PANEL
Cholesterol: 153 mg/dL (ref 0–200)
HDL: 56.5 mg/dL (ref >39.00)
LDL Cholesterol: 77 mg/dL (ref 0–99)
NonHDL: 96.46
Total CHOL/HDL Ratio: 3
Triglycerides: 97 mg/dL (ref 0.0–149.0)
VLDL: 19.4 mg/dL (ref 0.0–40.0)

## 2022-01-08 LAB — HEMOGLOBIN A1C: Hgb A1c MFr Bld: 6.1 % (ref 4.6–6.5)

## 2022-01-08 LAB — TSH: TSH: 1.61 u[IU]/mL (ref 0.35–5.50)

## 2022-01-08 NOTE — Telephone Encounter (Signed)
Lvm for pt to return call in regards to labs.  ?

## 2022-01-11 ENCOUNTER — Telehealth: Payer: Self-pay | Admitting: Internal Medicine

## 2022-01-11 NOTE — Telephone Encounter (Signed)
Please order and schedule urine culture on this pt of if office visit coming up can do then  ? ?Thank you ? ?

## 2022-01-11 NOTE — Telephone Encounter (Signed)
[facility] called and stated there was not enough urine to run a culture on this patient. ?

## 2022-01-12 ENCOUNTER — Ambulatory Visit (INDEPENDENT_AMBULATORY_CARE_PROVIDER_SITE_OTHER): Payer: Medicare HMO | Admitting: Internal Medicine

## 2022-01-12 ENCOUNTER — Encounter: Payer: Self-pay | Admitting: Internal Medicine

## 2022-01-12 VITALS — BP 120/70 | HR 83 | Temp 97.9°F | Resp 14 | Ht 67.0 in | Wt 213.4 lb

## 2022-01-12 DIAGNOSIS — N3000 Acute cystitis without hematuria: Secondary | ICD-10-CM | POA: Diagnosis not present

## 2022-01-12 DIAGNOSIS — Z1211 Encounter for screening for malignant neoplasm of colon: Secondary | ICD-10-CM

## 2022-01-12 DIAGNOSIS — R7303 Prediabetes: Secondary | ICD-10-CM

## 2022-01-12 DIAGNOSIS — E785 Hyperlipidemia, unspecified: Secondary | ICD-10-CM | POA: Diagnosis not present

## 2022-01-12 DIAGNOSIS — Z0000 Encounter for general adult medical examination without abnormal findings: Secondary | ICD-10-CM

## 2022-01-12 DIAGNOSIS — Z1231 Encounter for screening mammogram for malignant neoplasm of breast: Secondary | ICD-10-CM

## 2022-01-12 DIAGNOSIS — I10 Essential (primary) hypertension: Secondary | ICD-10-CM | POA: Diagnosis not present

## 2022-01-12 MED ORDER — TELMISARTAN-HCTZ 80-25 MG PO TABS: 1.0000 | ORAL_TABLET | Freq: Every day | ORAL | 3 refills | Status: DC

## 2022-01-12 MED ORDER — PRAVASTATIN SODIUM 20 MG PO TABS: ORAL_TABLET | ORAL | 3 refills | Status: DC

## 2022-01-12 NOTE — Telephone Encounter (Signed)
Urine cx collected today during OV 01/12/22.  ?

## 2022-01-12 NOTE — Patient Instructions (Addendum)
?Voltaren gel ok to use  ?Tumeric can be antiinflammatory  ? ?Dr. Vira Agar  ?[phone] Not available [address]  ? [place] [address]  ?   ? ?Specialties     ?Gastroenterology     ? ?Colonoscopy, Adult ?A colonoscopy is a procedure to look at the entire large intestine. This procedure is done using a long, thin, flexible tube that has a camera on the end. ?You may have a colonoscopy: ?As a part of normal colorectal screening. ?If you have certain symptoms, such as: ?A low number of red blood cells in your blood (anemia). ?Diarrhea that does not go away. ?Pain in your abdomen. ?Blood in your stool. ?A colonoscopy can help screen for and diagnose medical problems, including: ?An abnormal growth of cells or tissue (tumor). ?Abnormal growths within the lining of your intestine (polyps). ?Inflammation. ?Areas of bleeding. ?Tell your health care provider about: ?Any allergies you have. ?All medicines you are taking, including vitamins, herbs, eye drops, creams, and over-the-counter medicines. ?Any problems you or family members have had with anesthetic medicines. ?Any bleeding problems you have. ?Any surgeries you have had. ?Any medical conditions you have. ?Any problems you have had with having bowel movements. ?Whether you are pregnant or may be pregnant. ?What are the risks? ?Generally, this is a safe procedure. However, problems may occur, including: ?Bleeding. ?Damage to your intestine. ?Allergic reactions to medicines given during the procedure. ?Infection. This is rare. ?What happens before the procedure? ?Eating and drinking restrictions ?Follow instructions from your health care provider about eating or drinking restrictions, which may include: ?A few days before the procedure: ?Follow a low-fiber diet. ?Avoid nuts, seeds, dried fruit, raw fruits, and vegetables. ?1-3 days before the procedure: ?Eat only gelatin dessert or ice pops. ?Drink only clear liquids, such as water,  clear juice, clear broth or bouillon, black coffee or tea, or clear soft drinks or sports drinks. ?Avoid liquids that contain red or purple dye. ?The day of the procedure: ?Do not eat solid foods. You may continue to drink clear liquids until up to 2 hours before the procedure. ?Do not eat or drink anything starting 2 hours before the procedure, or within the time period that your health care provider recommends. ?Bowel prep ?If you were prescribed a bowel prep to take by mouth (orally) to clean out your colon: ?Take it as told by your health care provider. Starting the day before your procedure, you will need to drink a large amount of liquid medicine. The liquid will cause you to have many bowel movements of loose stool until your stool becomes almost clear or light green. ?If your skin or the opening between the buttocks (anus) gets irritated from diarrhea, you may relieve the irritation using: ?Wipes with medicine in them, such as adult wet wipes with aloe and vitamin E. ?A product to soothe skin, such as petroleum jelly. ?If you vomit while drinking the bowel prep: ?Take a break for up to 60 minutes. ?Begin the bowel prep again. ?Call your health care provider if you keep vomiting or you cannot take the bowel prep without vomiting. ?To clean out your colon, you may also be given: ?Laxative medicines. These help you have a bowel movement. ?Instructions for enema use. An enema is liquid medicine injected into your rectum. ?Medicines ?Ask your health care provider about: ?Changing or stopping your regular medicines or supplements. This is especially important if you are taking iron supplements, diabetes medicines, or blood thinners. ?Taking medicines  such as aspirin and ibuprofen. These medicines can thin your blood. Do not take these medicines unless your health care provider tells you to take them. ?Taking over-the-counter medicines, vitamins, herbs, and supplements. ?General instructions ?Ask your health care  provider what steps will be taken to help prevent infection. These may include washing skin with a germ-killing soap. ?If you will be going home right after the procedure, plan to have a responsible adult: ?Take you home from the hospital or clinic. You will not be allowed to drive. ?Care for you for the time you are told. ?What happens during the procedure? ? ?An IV will be inserted into one of your veins. ?You will be given a medicine to make you fall asleep (general anesthetic). ?You will lie on your side with your knees bent. ?A lubricant will be put on the tube. Then the tube will be: ?Inserted into your anus. ?Gently eased through all parts of your large intestine. ?Air will be sent into your colon to keep it open. This may cause some pressure or cramping. ?Images will be taken with the camera and will appear on a screen. ?A small tissue sample may be removed to be looked at under a microscope (biopsy). The tissue may be sent to a lab for testing if any signs of problems are found. ?If small polyps are found, they may be removed and checked for cancer cells. ?When the procedure is finished, the tube will be removed. ?The procedure may vary among health care providers and hospitals. ?What happens after the procedure? ?Your blood pressure, heart rate, breathing rate, and blood oxygen level will be monitored until you leave the hospital or clinic. ?You may have a small amount of blood in your stool. ?You may pass gas and have mild cramping or bloating in your abdomen. This is caused by the air that was used to open your colon during the exam. ?If you were given a sedative during the procedure, it can affect you for several hours. Do not drive or operate machinery until your health care provider says that it is safe. ?It is up to you to get the results of your procedure. Ask your health care provider, or the department that is doing the procedure, when your results will be ready. ?Summary ?A colonoscopy is a  procedure to look at the entire large intestine. ?Follow instructions from your health care provider about eating and drinking before the procedure. ?If you were prescribed an oral bowel prep to clean out your colon, take it as told by your health care provider. ?During the colonoscopy, a flexible tube with a camera on its end is inserted into the anus and then passed into all parts of the large intestine. ?This information is not intended to replace advice given to you by your health care provider. Make sure you discuss any questions you have with your health care provider. ?Document Revised: 08/24/2021 Document Reviewed: 04/22/2021 ?Elsevier Patient Education ? 2023 [employer] ? ?Prediabetes ?Prediabetes is when your blood sugar (blood glucose) level is higher than normal but not high enough for you to be diagnosed with type 2 diabetes. Having prediabetes puts you at risk for developing type 2 diabetes (type 2 diabetes mellitus). ?With certain lifestyle changes, you may be able to prevent or delay the onset of type 2 diabetes. This is important because type 2 diabetes can lead to serious complications, such as: ?Heart disease. ?Stroke. ?Blindness. ?Kidney disease. ?Depression. ?Poor circulation in the feet and legs. In severe  cases, this could lead to surgical removal of a leg (amputation). ?What are the causes? ?The exact cause of prediabetes is not known. It may result from insulin resistance. Insulin resistance develops when cells in the body do not respond properly to insulin that the body makes. This can cause excess glucose to build up in the blood. High blood glucose (hyperglycemia) can develop. ?What increases the risk? ?The following factors may make you more likely to develop this condition: ?You have a family member with type 2 diabetes. ?You are older than 45 years. ?You had a temporary form of diabetes during a pregnancy (gestational diabetes). ?You had polycystic ovary syndrome (PCOS). ?You are  overweight or obese. ?You are inactive (sedentary). ?You have a history of heart disease, including problems with cholesterol levels, high levels of blood fats, or high blood pressure. ?What are the signs or sy

## 2022-01-12 NOTE — Progress Notes (Signed)
Chief Complaint  ?Patient presents with  ? Annual Exam  ?  Labs completed 01/08/22  ? ?Annual  ?1. Htn.hld on micardis hct 80-25 mg qd pravachol 20 mg qd controlled ? ?Review of Systems  ?Constitutional:  Negative for weight loss.  ?HENT:  Negative for hearing loss.   ?Eyes:  Negative for blurred vision.  ?Respiratory:  Negative for shortness of breath.   ?Cardiovascular:  Negative for chest pain.  ?Gastrointestinal:  Negative for abdominal pain and blood in stool.  ?Genitourinary:  Negative for dysuria.  ?Musculoskeletal:  Negative for falls and joint pain.  ?Skin:  Negative for rash.  ?Neurological:  Negative for headaches.  ?Psychiatric/Behavioral:  Negative for depression.   ?Past Medical History:  ?Diagnosis Date  ? Blood transfusion without reported diagnosis   ? Cataract   ? ? which eye wears glasses   ? Hyperlipidemia   ? Hypertension   ? Prediabetes   ? ?Past Surgical History:  ?Procedure Laterality Date  ? ABDOMINAL HYSTERECTOMY    ? over 30 years fibroids no h/o abnormal pap   ? ?Family History  ?Problem Relation Age of Onset  ? Arthritis Mother   ? Diabetes Mother   ? Hypertension Mother   ? Alcohol abuse Father   ? Arthritis Father   ? Dementia Father   ? Stroke Sister   ? Congestive Heart Failure Sister   ? Other Sister   ?     09/13/19 covid 19+  ? Leukemia Sister   ?     ? type dx 45  ? ?Social History  ? ?Socioeconomic History  ? Marital status: Single  ?  Spouse name: Not on file  ? Number of children: 0  ? Years of education: Not on file  ? Highest education level: Not on file  ?Occupational History  ? Not on file  ?Tobacco Use  ? Smoking status: Former  ? Smokeless tobacco: Never  ? Tobacco comments:  ?  in 30s smoked x 2 years max 4-5 cig/day  ?Substance and Sexual Activity  ? Alcohol use: No  ? Drug use: No  ? Sexual activity: Never  ?Other Topics Concern  ? Not on file  ?Social History Narrative  ? 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 Moved from [facility] recently 09/2017  originally from [place]   ? No kids, 5 siblings she is 8rd of 5 children   ? ?Social Determinants of Health  ? ?Financial Resource Strain: Not on file  ?Food Insecurity: Not on file  ?Transportation Needs: Not on file  ?Physical Activity: Not on file  ?Stress: Not on file  ?Social Connections: Not on file  ?Intimate Partner Violence: Not on file  ? ?Current Meds  ?Medication Sig  ? Cholecalciferol (VITAMIN D3) 1000 units CAPS Take by mouth.  ? cyanocobalamin 1000 MCG tablet Take 1,000 mcg by mouth daily.  ? Multiple Vitamins-Minerals (MULTIVITAMIN WOMEN) TABS Take by mouth daily.  ? [DISCONTINUED] pravastatin (PRAVACHOL) 20 MG tablet TAKE 1 TABLET(20 MG) BY MOUTH AT BEDTIME  ? [DISCONTINUED] telmisartan-hydrochlorothiazide (MICARDIS HCT) 80-25 MG tablet Take 1 tablet by mouth daily. In am  ? ?Allergies  ?Allergen Reactions  ? Lipitor [Atorvastatin Calcium]   ?  feet, hands, ankles swelling per pt  ?  ? Penicillins   ?  Vaginal infections  ? ?Recent Results (from the past 2160 hour(s))  ?TSH     Status: None  ? Collection Time: 01/08/22  8:43 AM  ?Result  Value Ref Range  ? TSH 1.61 0.35 - 5.50 uIU/mL  ?Hemoglobin A1c     Status: None  ? Collection Time: 01/08/22  8:43 AM  ?Result Value Ref Range  ? Hgb A1c MFr Bld 6.1 4.6 - 6.5 %  ?  Comment: Glycemic Control Guidelines for People with Diabetes:Non Diabetic:  <6%Goal of Therapy: <7%Additional Action Suggested:  >8%   ?Urinalysis, Routine w reflex microscopic     Status: Abnormal  ? Collection Time: 01/08/22  8:43 AM  ?Result Value Ref Range  ? Color, Urine YELLOW Yellow;Lt. Yellow;Straw;Dark Yellow;Amber;Green;Red;Brown  ? APPearance Sl Cloudy (A) Clear;Turbid;Slightly Cloudy;Cloudy  ? Specific Gravity, Urine 1.010 1.000 - 1.030  ? pH 7.5 5.0 - 8.0  ? Total Protein, Urine NEGATIVE Negative  ? Urine Glucose NEGATIVE Negative  ? Ketones, ur NEGATIVE Negative  ? Bilirubin Urine NEGATIVE Negative  ? Hgb urine dipstick NEGATIVE Negative  ? Urobilinogen, UA 0.2 0.0 - 1.0  ?  Leukocytes,Ua TRACE (A) Negative  ? Nitrite NEGATIVE Negative  ? WBC, UA 0-2/hpf 0-2/hpf  ? RBC / HPF none seen 0-2/hpf  ? Mucus, UA Presence of (A) None  ? Squamous Epithelial / LPF Few(5-10/hpf) (A) Rare(0-4/hpf)  ? Bacteria, UA Few(10-50/hpf) (A) None  ?Lipid panel     Status: None  ? Collection Time: 01/08/22  8:43 AM  ?Result Value Ref Range  ? Cholesterol 153 0 - 200 mg/dL  ?  Comment: ATP III Classification       Desirable:  < 200 mg/dL               Borderline High:  200 - 239 mg/dL          High:  > = 240 mg/dL  ? Triglycerides 97.0 0.0 - 149.0 mg/dL  ?  Comment: Normal:  <150 mg/dLBorderline High:  150 - 199 mg/dL  ? HDL 56.50 >39.00 mg/dL  ? VLDL 19.4 0.0 - 40.0 mg/dL  ? LDL Cholesterol 77 0 - 99 mg/dL  ? Total CHOL/HDL Ratio 3   ?  Comment:                Men          Women1/2 Average Risk     3.4          3.3Average Risk          5.0          4.42X Average Risk          9.6          7.13X Average Risk          15.0          11.0                      ? NonHDL 96.46   ?  Comment: NOTE:  Non-HDL goal should be 30 mg/dL higher than patient's LDL goal (i.e. LDL goal of < 70 mg/dL, would have non-HDL goal of < 100 mg/dL)  ?Comprehensive metabolic panel     Status: Abnormal  ? Collection Time: 01/08/22  8:43 AM  ?Result Value Ref Range  ? Sodium 142 135 - 145 mEq/L  ? Potassium 3.7 3.5 - 5.1 mEq/L  ? Chloride 106 96 - 112 mEq/L  ? CO2 31 19 - 32 mEq/L  ? Glucose, Bld 93 70 - 99 mg/dL  ? BUN 15 6 - 23 mg/dL  ? Creatinine, Ser 0.98 0.40 - 1.20 mg/dL  ?  Total Bilirubin 1.2 0.2 - 1.2 mg/dL  ? Alkaline Phosphatase 59 39 - 117 U/L  ? AST 16 0 - 37 U/L  ? ALT 15 0 - 35 U/L  ? Total Protein 6.6 6.0 - 8.3 g/dL  ? Albumin 4.1 3.5 - 5.2 g/dL  ? GFR 56.69 (L) >60.00 mL/min  ?  Comment: Calculated using the CKD-EPI Creatinine Equation (2021)  ? Calcium 9.1 8.4 - 10.5 mg/dL  ?CBC with Differential/Platelet     Status: Abnormal  ? Collection Time: 01/08/22  8:43 AM  ?Result Value Ref Range  ? WBC 3.5 (L) 4.0 - 10.5 K/uL  ?  RBC 4.40 3.87 - 5.11 Mil/uL  ? Hemoglobin 12.6 12.0 - 15.0 g/dL  ? HCT 39.5 36.0 - 46.0 %  ? MCV 89.7 78.0 - 100.0 fl  ? MCHC 32.0 30.0 - 36.0 g/dL  ? RDW 14.4 11.5 - 15.5 %  ? Platelets 199.0 150.0 - 400.0 K/uL  ? Neutrophils Relative % 49.8 43.0 - 77.0 %  ? Lymphocytes Relative 36.7 12.0 - 46.0 %  ? Monocytes Relative 9.3 3.0 - 12.0 %  ? Eosinophils Relative 3.5 0.0 - 5.0 %  ? Basophils Relative 0.7 0.0 - 3.0 %  ? Neutro Abs 1.8 1.4 - 7.7 K/uL  ? Lymphs Abs 1.3 0.7 - 4.0 K/uL  ? Monocytes Absolute 0.3 0.1 - 1.0 K/uL  ? Eosinophils Absolute 0.1 0.0 - 0.7 K/uL  ? Basophils Absolute 0.0 0.0 - 0.1 K/uL  ? ?Objective  ?Body mass index is 33.42 kg/m?. ?Wt Readings from Last 3 Encounters:  ?01/12/22 213 lb 6.4 oz (96.8 kg)  ?01/01/21 215 lb 12.8 oz (97.9 kg)  ?07/02/20 215 lb (97.5 kg)  ? ?Temp Readings from Last 3 Encounters:  ?01/12/22 97.9 ?F (36.6 ?C) (Oral)  ?01/01/21 97.9 ?F (36.6 ?C)  ?06/18/20 97.8 ?F (36.6 ?C)  ? ?BP Readings from Last 3 Encounters:  ?01/12/22 120/70  ?01/01/21 136/78  ?07/02/20 117/75  ? ?Pulse Readings from Last 3 Encounters:  ?01/12/22 83  ?01/01/21 92  ?07/02/20 70  ? ? ?Physical Exam ?Vitals and nursing note reviewed.  ?Constitutional:   ?   Appearance: Normal appearance. She is well-developed and well-groomed.  ?HENT:  ?   Head: Normocephalic and atraumatic.  ?Eyes:  ?   Conjunctiva/sclera: Conjunctivae normal.  ?   Pupils: Pupils are equal, round, and reactive to light.  ?Cardiovascular:  ?   Rate and Rhythm: Normal rate and regular rhythm.  ?   Heart sounds: Normal heart sounds. No murmur heard. ?Pulmonary:  ?   Effort: Pulmonary effort is normal.  ?   Breath sounds: Normal breath sounds.  ?Abdominal:  ?   General: Abdomen is flat. Bowel sounds are normal.  ?   Tenderness: There is no abdominal tenderness.  ?Musculoskeletal:     ?   General: No tenderness.  ?Skin: ?   General: Skin is warm and dry.  ?Neurological:  ?   General: No focal deficit present.  ?   Mental Status: She is alert  and oriented to person, place, and time. Mental status is at baseline.  ?   Cranial Nerves: Cranial nerves 2-12 are intact.  ?   Sensory: Sensation is intact.  ?   Motor: Motor function is intact.  ?   Coordi

## 2022-01-12 NOTE — Addendum Note (Signed)
Addended by: Orland Mustard on: 01/12/2022 10:18 AM ? ? Modules accepted: Orders ? ?

## 2022-01-13 LAB — URINE CULTURE
MICRO NUMBER:: 13340067
SPECIMEN QUALITY:: ADEQUATE

## 2022-01-14 ENCOUNTER — Telehealth: Payer: Self-pay

## 2022-01-14 NOTE — Telephone Encounter (Signed)
Lvm for pt to return call in regards to urine culture results. ? ?Per Dr.Tracy: ?No uti ?

## 2022-01-18 NOTE — Progress Notes (Signed)
S/w pt's pharmacy last covid vx was on 06/27/21. Vx updated in pt's chart.  ?

## 2022-02-17 ENCOUNTER — Telehealth: Payer: Self-pay | Admitting: Internal Medicine

## 2022-02-17 NOTE — Telephone Encounter (Signed)
Copied from CRM [id number]. Topic: Medicare AWV >> Feb 17, 2022  1:04 PM Harris-Coley, Avon Gully wrote: Reason for CRM: Left message for patient to schedule Annual Wellness Visit.  Please schedule with Nurse Health Advisor Denisa O'Brien-Blaney, LPN at [facility].  Please call [phone] ask for [facility]

## 2022-03-15 ENCOUNTER — Telehealth: Payer: Self-pay | Admitting: Internal Medicine

## 2022-03-15 NOTE — Telephone Encounter (Signed)
Copied from CRM [id number]. Topic: Medicare AWV >> Mar 15, 2022  1:36 PM Payton Doughty wrote: Reason for CRM: Left message for patient to schedule Annual Wellness Visit.  Please schedule with Nurse Health Advisor Denisa O'Brien-Blaney, LPN at [facility].  Please call [phone] ask for [facility]

## 2022-03-29 ENCOUNTER — Ambulatory Visit
Admission: RE | Admit: 2022-03-29 | Discharge: 2022-03-29 | Disposition: A | Discharge status: Home / Self Care | Payer: Medicare HMO | Source: Ambulatory Visit | Attending: Internal Medicine | Admitting: Internal Medicine

## 2022-03-29 DIAGNOSIS — Z1231 Encounter for screening mammogram for malignant neoplasm of breast: Secondary | ICD-10-CM | POA: Diagnosis not present

## 2022-04-06 ENCOUNTER — Telehealth: Payer: Self-pay | Admitting: Internal Medicine

## 2022-04-06 NOTE — Telephone Encounter (Signed)
Copied from CRM [id number]. Topic: Medicare AWV >> Apr 06, 2022 11:04 AM Payton Doughty wrote: Reason for CRM: Left message for patient to schedule Annual Wellness Visit.  Please schedule with Nurse Health Advisor Denisa O'Brien-Blaney, LPN at [facility]. This appt can be telephone or office visit.  Please call [phone] ask for [facility]

## 2022-04-08 DIAGNOSIS — Z1211 Encounter for screening for malignant neoplasm of colon: Secondary | ICD-10-CM | POA: Diagnosis not present

## 2022-05-26 ENCOUNTER — Telehealth: Payer: Self-pay | Admitting: Internal Medicine

## 2022-05-26 NOTE — Telephone Encounter (Signed)
Spoke with patient she declined AWV for this year  

## 2022-07-05 ENCOUNTER — Encounter: Payer: Self-pay | Admitting: Family Medicine

## 2022-07-05 ENCOUNTER — Telehealth: Payer: Self-pay

## 2022-07-05 ENCOUNTER — Telehealth (INDEPENDENT_AMBULATORY_CARE_PROVIDER_SITE_OTHER): Payer: Medicare HMO | Admitting: Family Medicine

## 2022-07-05 DIAGNOSIS — R6883 Chills (without fever): Secondary | ICD-10-CM | POA: Diagnosis not present

## 2022-07-05 DIAGNOSIS — J069 Acute upper respiratory infection, unspecified: Secondary | ICD-10-CM | POA: Diagnosis not present

## 2022-07-05 DIAGNOSIS — U071 COVID-19: Secondary | ICD-10-CM

## 2022-07-05 HISTORY — DX: Acute upper respiratory infection, unspecified: J06.9

## 2022-07-05 LAB — POC COVID19 BINAXNOW: SARS Coronavirus 2 Ag: POSITIVE — AB

## 2022-07-05 MED ORDER — MOLNUPIRAVIR EUA 200MG CAPSULE: 4.0000 | ORAL_CAPSULE | Freq: Two times a day (BID) | ORAL | 0 refills | Status: AC

## 2022-07-05 NOTE — Telephone Encounter (Signed)
Sent Patient a link to get on the virtual visit and let her know if she could not get on to call us so we can troubleshoot the problem. Link sent to [phone].

## 2022-07-05 NOTE — Assessment & Plan Note (Addendum)
Likely viral URI given small improvement in symptoms today. In no acute respiratory distress. Limited exam given nature of visit.   -COVID testing today, patient to come for test -If positive can start Molnupiravir 200 mg 4 capsules BID x 5 days -Symptomatic management for fever, muscle aches and headaches  -Tylenol 325-500 mg every 6 hours as needed -Stay well hydrated -Strict ED/return precautions provided -Follow up with PCP as scheduled

## 2022-07-05 NOTE — Patient Instructions (Addendum)
It was a pleasure meeting you today. Thank you for allowing me to take part in your health care.  Our goals for today as we discussed include:  Your COVID test is positive Start Molnupiravir 200 mg 4 capsules two times a day for 5 days.  Symptomatic management for fever, muscle aches and headaches  -Tylenol 325-500 mg every 6 hours as needed   Stay well hydrated Rest as needed with frequent repositioning and ambulation.  Increase activity as soon as tolerated to help with recovery.  Continue wearing masks, hand washing and self isolation until symptom free.  If you have worsening symptoms, especially difficulty breathing please call 911 or have someone take you to the emergency department.   You are scheduled for a Colonoscopy 12/14  Recommend Shingles vaccine.  This is a 2 dose series and can be given at your local pharmacy.  Please talk to your pharmacist about this.  Recommend Flu vaccine Recommend RSV vaccine Recommend Pneumonia 20 vaccine  Please follow-up with PCP as scheduled or sooner if symptoms worsen  If you have any questions or concerns, please do not hesitate to call the office at [phone].  I look forward to our next visit and until then take care and stay safe.  Regards,   Dana Allan, MD   [place]    Molnupiravir Capsules What is this medication? MOLNUPIRAVIR (MOL nue PIR a vir) treats mild to moderate COVID-19. It may help people who are at high risk of developing severe illness. This medication works by limiting the spread of the virus in your body. The FDA has allowed the emergency use of this medication. This medicine may be used for other purposes; ask your health care provider or pharmacist if you have questions. COMMON BRAND NAME(S): LAGEVRIO What should I tell my care team before I take this medication? They need to know if you have any of these conditions: Any allergies Any serious illness An unusual or allergic reaction  to molnupiravir, other medications, foods, dyes, or preservatives Pregnant or trying to get pregnant Breast-feeding How should I use this medication? Take this medication by mouth with water. Take it as directed on the prescription label at the same time every day. Do not cut, crush, or chew this medication. Swallow the capsules whole. You can take it with or without food. If it upsets your stomach, take it with food. Take all of it unless your care team tells you to stop it early. Keep taking it even if you think you are better. Talk to your care team about the use of this medication in children. Special care may be needed. Overdosage: If you think you have taken too much of this medicine contact a poison control center or emergency room at once. NOTE: This medicine is only for you. Do not share this medicine with others. What if I miss a dose? If you miss a dose, take it as soon as you can unless it is more than 10 hours late. If it is more than 10 hours late, skip the missed dose. Take the next dose at the normal time. Do not take extra or 2 doses at the same time to make up for the missed dose. What may interact with this medication? Interactions have not been studied. This list may not describe all possible interactions. Give your health care provider a list of all the medicines, herbs, non-prescription drugs, or dietary supplements you use. Also tell them if you smoke, drink alcohol,  or use illegal drugs. Some items may interact with your medicine. What should I watch for while using this medication? Your condition will be monitored carefully while you are receiving this medication. Visit your care team for regular checkups. Tell your care team if your symptoms do not start to get better or if they get worse. Do not become pregnant while taking this medication. You may need a pregnancy test before starting this medication. Women must use a reliable form of birth control while taking this  medication and for 4 days after stopping the medication. Women should inform their care team if they wish to become pregnant or think they might be pregnant. Men should not father a child while taking this medication and for 3 months after stopping it. There is potential for serious harm to an unborn child. Talk to your care team for more information. Do not breast-feed an infant while taking this medication and for 4 days after stopping the medication. What side effects may I notice from receiving this medication? Side effects that you should report to your care team as soon as possible: Allergic reactions--skin rash, itching, hives, swelling of the face, lips, tongue, or throat Side effects that usually do not require medical attention (report these to your care team if they continue or are bothersome): Diarrhea Dizziness Nausea This list may not describe all possible side effects. Call your doctor for medical advice about side effects. You may report side effects to FDA at 1-800-FDA-1088. Where should I keep my medication? Keep out of the reach of children and pets. Store at room temperature between 20 and 25 degrees C (68 and 77 degrees F). Get rid of any unused medication after the expiration date. To get rid of medications that are no longer needed or have expired: Take the medication to a medication take-back program. Check with your pharmacy or law enforcement to find a location. If you cannot return the medication, check the label or package insert to see if the medication should be thrown out in the garbage or flushed down the toilet. If you are not sure, ask your care team. If it is safe to put it in the trash, take the medication out of the container. Mix the medication with cat litter, dirt, coffee grounds, or other unwanted substance. Seal the mixture in a bag or container. Put it in the trash. NOTE: This sheet is a summary. It may not cover all possible information. If you have  questions about this medicine, talk to your doctor, pharmacist, or health care provider.  2023 Elsevier/Gold Standard (2020-09-08 00:00:00)

## 2022-07-05 NOTE — Progress Notes (Signed)
[facility] Tele[facility] Visit  Patient consented to have virtual visit and was identified by name and date of birth. Method of visit: Video was attempted but was interrupted: <50% of visit completed via video  Encounter participants: Patient: Stacy Vasquez - located at home  Provider: Carollee Leitz - located at office Others (if applicable): n/a  Chief Complaint: COVID symptoms  HPI:  Symptoms started yesterday evening with chills and body aches.  Today is a little better.  Having runny nose and nasale congestion.  Intermittent nonproductive cough.  Denies any fevers, shortness of breath, chest pain, nausea/vomiting, diarrhea , weight loss, decrease in appetite.  Hydrating well.  Denies any recent sick contacts.  Reports took home COVID test but not sure if positive. Has been fully vaccinated.  Was scheduled to get next booster tomorrow.  ROS: per HPI  Pertinent PMHx:  HTN Allergic Rhinitis  Exam:  There were no vitals taken for this visit.  Respiratory: Speaking in full sentences. No increased work of breathing. No cough during telephone visit  Assessment/Plan:  Upper respiratory infection, viral Likely viral URI given small improvement in symptoms today. In no acute respiratory distress. Limited exam given nature of visit.   -COVID testing today, patient to come for test -If positive can start Molnupiravir 200 mg 4 capsules BID x 5 days -Symptomatic management for fever, muscle aches and headaches  -Tylenol 325-500 mg every 6 hours as needed -Stay well hydrated -Strict ED/return precautions provided -Follow up with PCP as scheduled   HCM Mammogram UTD due 03/2023 DEXA reviewed, osteopenic.  due, last completed 11/2019 Colonoscopy scheduled 08/26/2022  PDMP Reviewed  Time spent during visit with patient: 20 minutes

## 2022-07-19 ENCOUNTER — Other Ambulatory Visit: Payer: Medicare HMO

## 2022-07-21 ENCOUNTER — Ambulatory Visit: Payer: Medicare HMO | Admitting: Internal Medicine

## 2022-08-24 ENCOUNTER — Encounter: Payer: Self-pay | Admitting: Family Medicine

## 2022-08-24 ENCOUNTER — Ambulatory Visit (INDEPENDENT_AMBULATORY_CARE_PROVIDER_SITE_OTHER): Payer: Medicare HMO | Admitting: Family Medicine

## 2022-08-24 VITALS — BP 136/78 | HR 78 | Temp 98.3°F | Ht 67.0 in | Wt 206.2 lb

## 2022-08-24 DIAGNOSIS — Z1211 Encounter for screening for malignant neoplasm of colon: Secondary | ICD-10-CM | POA: Diagnosis not present

## 2022-08-24 DIAGNOSIS — R7303 Prediabetes: Secondary | ICD-10-CM

## 2022-08-24 DIAGNOSIS — M8580 Other specified disorders of bone density and structure, unspecified site: Secondary | ICD-10-CM

## 2022-08-24 DIAGNOSIS — E041 Nontoxic single thyroid nodule: Secondary | ICD-10-CM | POA: Diagnosis not present

## 2022-08-24 DIAGNOSIS — E785 Hyperlipidemia, unspecified: Secondary | ICD-10-CM

## 2022-08-24 DIAGNOSIS — I10 Essential (primary) hypertension: Secondary | ICD-10-CM

## 2022-08-24 NOTE — Patient Instructions (Signed)
It was a pleasure meeting you today. Thank you for allowing me to take part in your health care.  Our goals for today as we discussed include:  Schedule lab appointment 1 week prior to your next visit.  Need to fast for 12 hours  Continue Micardis  HCT 80-25 mg daily  Continue Pravachol 20 mg daily   If you have any questions or concerns, please do not hesitate to call the office at [phone].  I look forward to our next visit and until then take care and stay safe.  Regards,   Dana Allan, MD   [facility]

## 2022-08-24 NOTE — Progress Notes (Signed)
SUBJECTIVE:   Chief Complaint  Patient presents with   Establish Care    Transfer of Care-Dr. Olivia Mackie   HPI Patient presents to clinic to transfer care  No acute concerns  Hypertension Asymptomatic.  Currently on telmisartan-HCTZ 80-25 mg daily and tolerating medication well.  Denies any visual changes, headaches, shortness of breath, chest pain or lower extremity edema.  Hyperlipidemia On statin therapy and tolerating medication well.  Takes Pravachol 20 mg daily.  PERTINENT PMH / PSH: HTN Hyperlipidemia Prediabetes Bilateral cataracts Arthritis Osteopenia Thyroid nodule  OBJECTIVE:  BP 136/78   Pulse 78   Temp 98.3 F (36.8 C)   Ht 5\' 7"  (1.702 m)   Wt 206 lb 3.2 oz (93.5 kg)   SpO2 97%   BMI 32.30 kg/m    Physical Exam Vitals reviewed.  Constitutional:      General: She is not in acute distress.    Appearance: She is not ill-appearing.  HENT:     Head: Normocephalic.     Nose: Nose normal.  Eyes:     Conjunctiva/sclera: Conjunctivae normal.  Cardiovascular:     Rate and Rhythm: Normal rate and regular rhythm.     Heart sounds: Normal heart sounds.  Pulmonary:     Effort: Pulmonary effort is normal.     Breath sounds: Normal breath sounds.  Abdominal:     General: Abdomen is flat. Bowel sounds are normal.     Palpations: Abdomen is soft.  Musculoskeletal:        General: Normal range of motion.     Cervical back: Normal range of motion.  Skin:    General: Skin is warm.  Neurological:     Mental Status: She is alert and oriented to person, place, and time. Mental status is at baseline.  Psychiatric:        Mood and Affect: Mood normal.        Behavior: Behavior normal.        Thought Content: Thought content normal.        Judgment: Judgment normal.     ASSESSMENT/PLAN:  Thyroid nodule Assessment & Plan: Chronic.  Asymptomatic.  Recent TSH within normal limits. Yearly TSH screening  Orders: -     TSH; Future  Primary  hypertension Assessment & Plan: Chronic.  Stable.  Per JNC 8 guidelines at goal for age less than 150/90.  Recent creatinine within normal limits Continue Micardis HCT 80-25 mg daily Repeat CMET at next visit   Orders: -     Comprehensive metabolic panel; Future  Prediabetes Assessment & Plan: Chronic.  Stable.  A1c's range from 6.1-6.4.  Most recent A1c 6.1 Repeat A1c at next visit if remains stable can monitor yearly. Continue lifestyle modification  Orders: -     Hemoglobin A1c; Future -     CBC with Differential/Platelet; Future -     Vitamin B12; Future  Hyperlipidemia, unspecified hyperlipidemia type Assessment & Plan: Chronic.  Stable.  Tolerating statin therapy.  No myalgias.  Recent LDL less than 100 and at goal Continue Pravachol 20 mg daily Repeat lipids yearly  Orders: -     Lipid panel; Future  Osteopenia, unspecified location Assessment & Plan: Chronic.  Stable.  Takes vitamin D 25 mcg daily.  Not currently on calcium supplementation. DEXA completed 2018. Recommend calcium supplementation 1200 mg daily Continue vitamin D supplements, daily recommendations 800 international units daily Check vitamin D levels at next visit  Orders: -     VITAMIN D 25  Hydroxy (Vit-D Deficiency, Fractures); Future  Colon cancer screening -     Cologuard   PDMP reviewed  Return in about 4 months (around 12/24/2022).  Dana Allan, MD

## 2022-08-26 ENCOUNTER — Ambulatory Visit: Admit: 2022-08-26 | Payer: Medicare HMO | Admitting: Gastroenterology

## 2022-08-26 SURGERY — COLONOSCOPY
Anesthesia: General

## 2022-09-12 ENCOUNTER — Encounter: Payer: Self-pay | Admitting: Family Medicine

## 2022-09-12 NOTE — Assessment & Plan Note (Signed)
Chronic.  Stable.  Takes vitamin D 25 mcg daily.  Not currently on calcium supplementation. DEXA completed 2018. Recommend calcium supplementation 1200 mg daily Continue vitamin D supplements, daily recommendations 800 international units daily Check vitamin D levels at next visit

## 2022-09-12 NOTE — Assessment & Plan Note (Signed)
Chronic.  Stable.  Per JNC 8 guidelines at goal for age less than 150/90.  Recent creatinine within normal limits Continue Micardis HCT 80-25 mg daily Repeat CMET at next visit

## 2022-09-12 NOTE — Assessment & Plan Note (Signed)
Chronic.  Stable.  A1c's range from 6.1-6.4.  Most recent A1c 6.1 Repeat A1c at next visit if remains stable can monitor yearly. Continue lifestyle modification

## 2022-09-12 NOTE — Assessment & Plan Note (Signed)
Chronic.  Stable.  Tolerating statin therapy.  No myalgias.  Recent LDL less than 100 and at goal Continue Pravachol 20 mg daily Repeat lipids yearly

## 2022-09-12 NOTE — Assessment & Plan Note (Signed)
Chronic.  Asymptomatic.  Recent TSH within normal limits. Yearly TSH screening

## 2022-11-11 IMAGING — MG MM DIGITAL SCREENING BILAT W/ TOMO AND CAD
8 series · 8 of 24 positions shown · non-contrast
Comparison: Previous exam(s).

CLINICAL DATA: Screening.

EXAM:
DIGITAL SCREENING BILATERAL MAMMOGRAM WITH TOMOSYNTHESIS AND CAD
TECHNIQUE: Bilateral screening digital craniocaudal and mediolateral oblique
mammograms were obtained. Bilateral screening digital breast
tomosynthesis was performed. The images were evaluated with
computer-aided detection.

[L CC synth-2D]
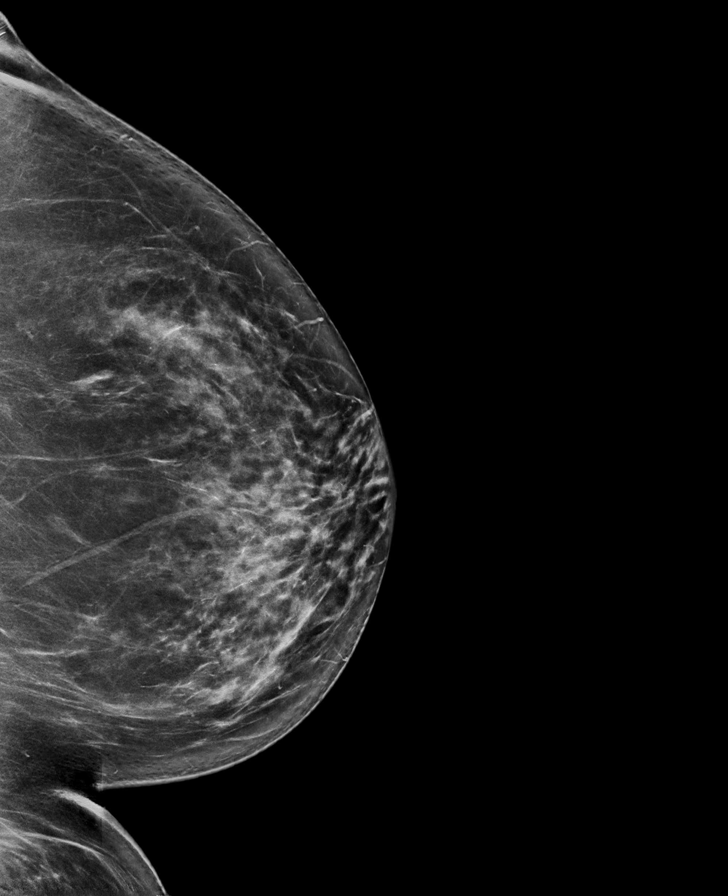

[R MLO synth-2D]
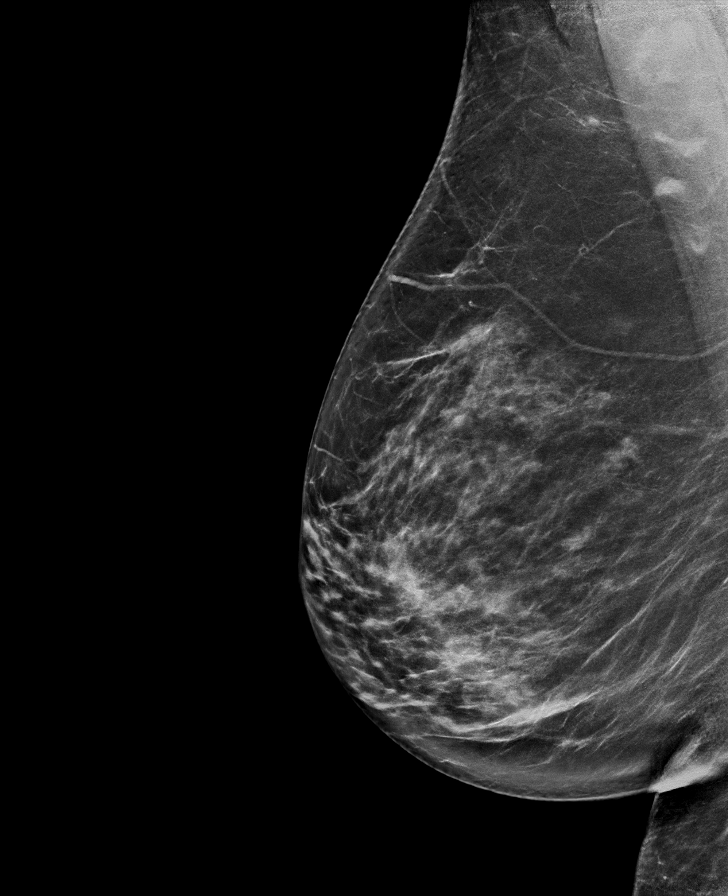

[R CC synth-2D]
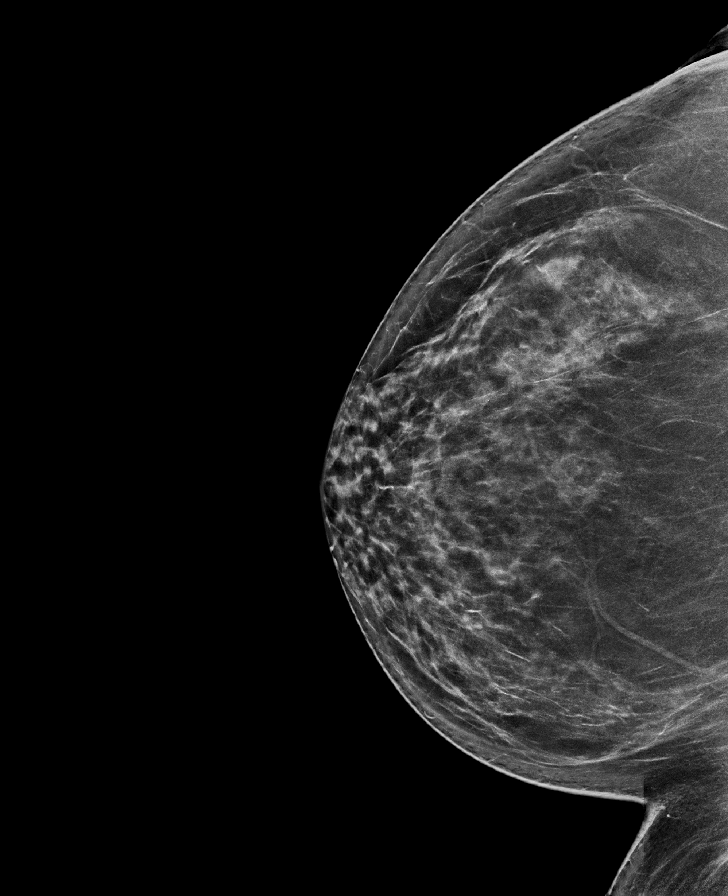

[L MLO synth-2D]
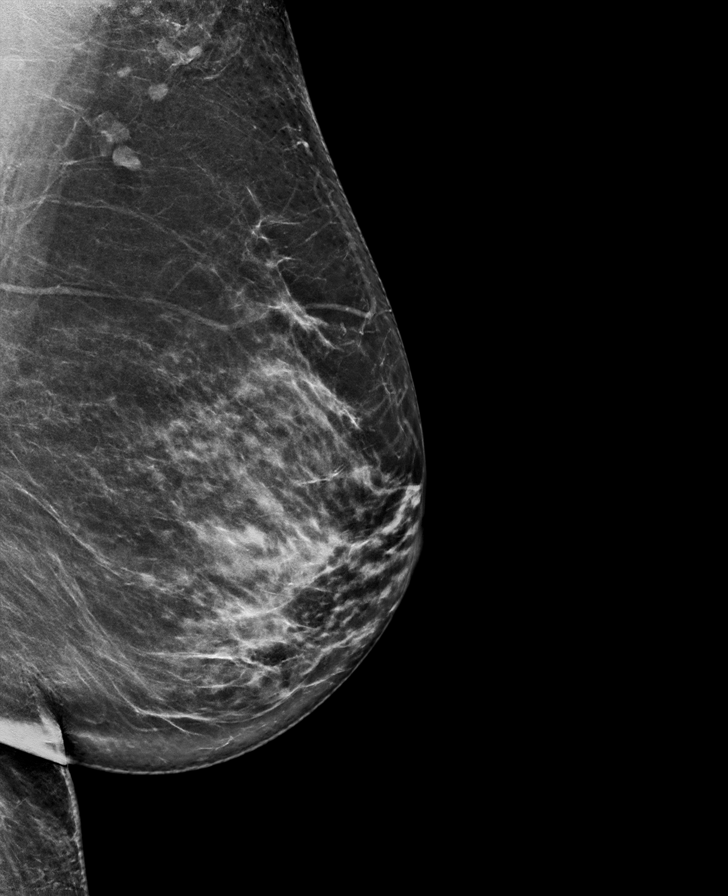

[L CC tomo · tomo slice 41/81.0]
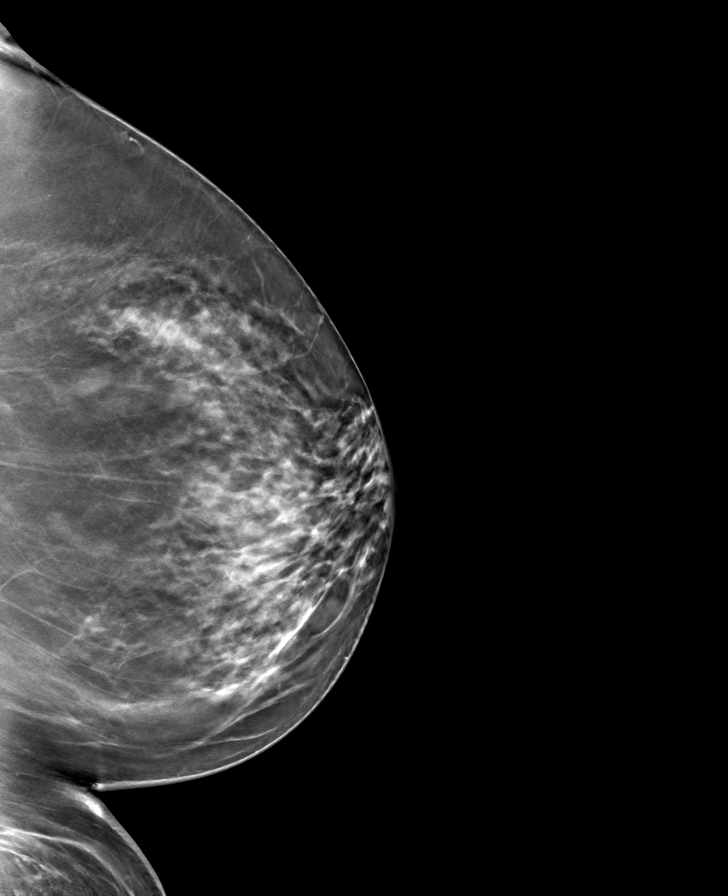

[L MLO tomo · tomo slice 41/80.0]
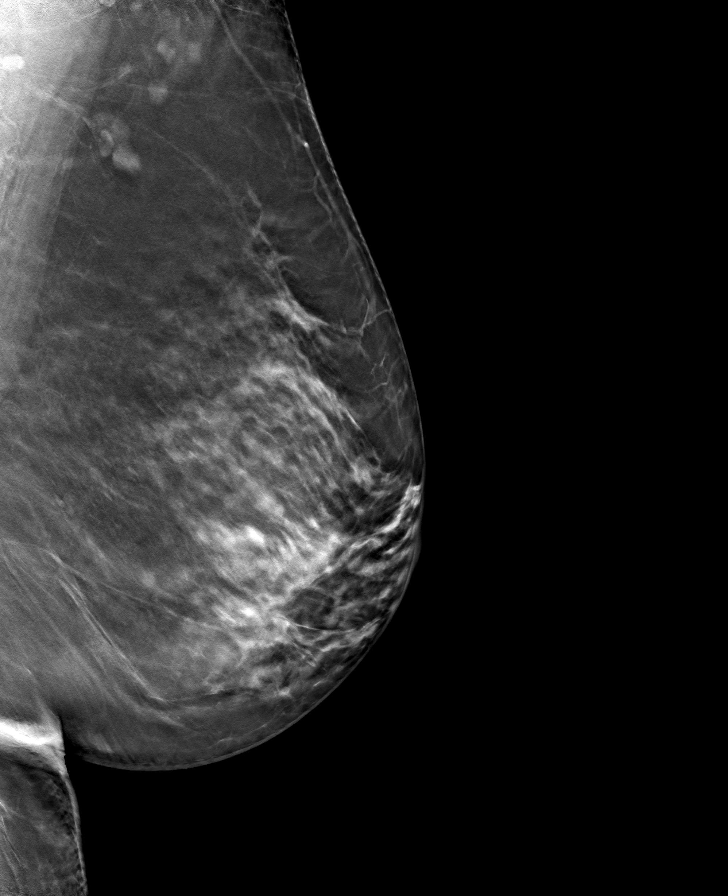

[R MLO tomo · tomo slice 43/84.0]
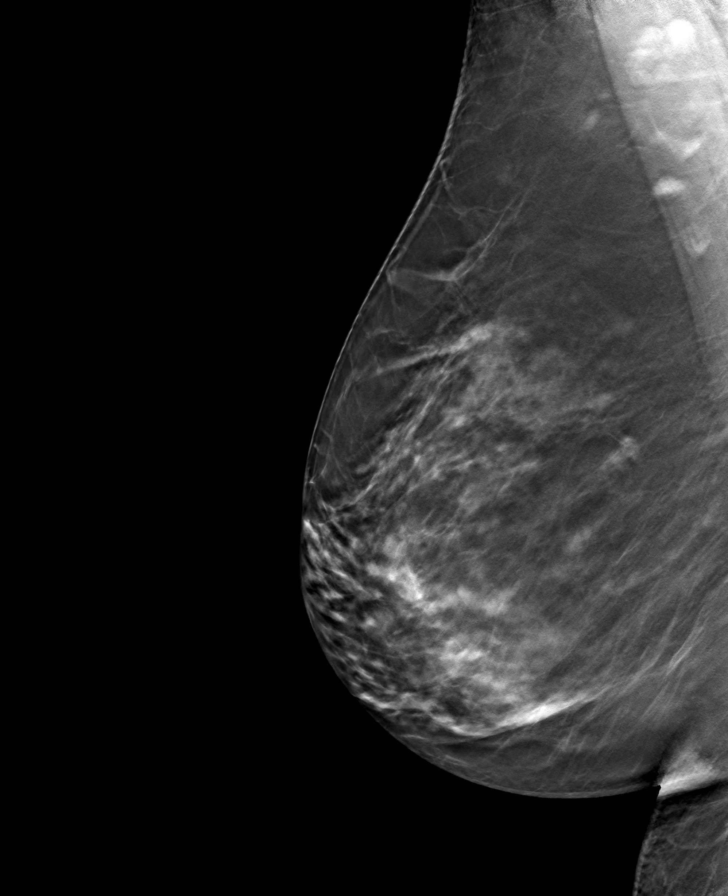

[R CC tomo · tomo slice 40/79.0]
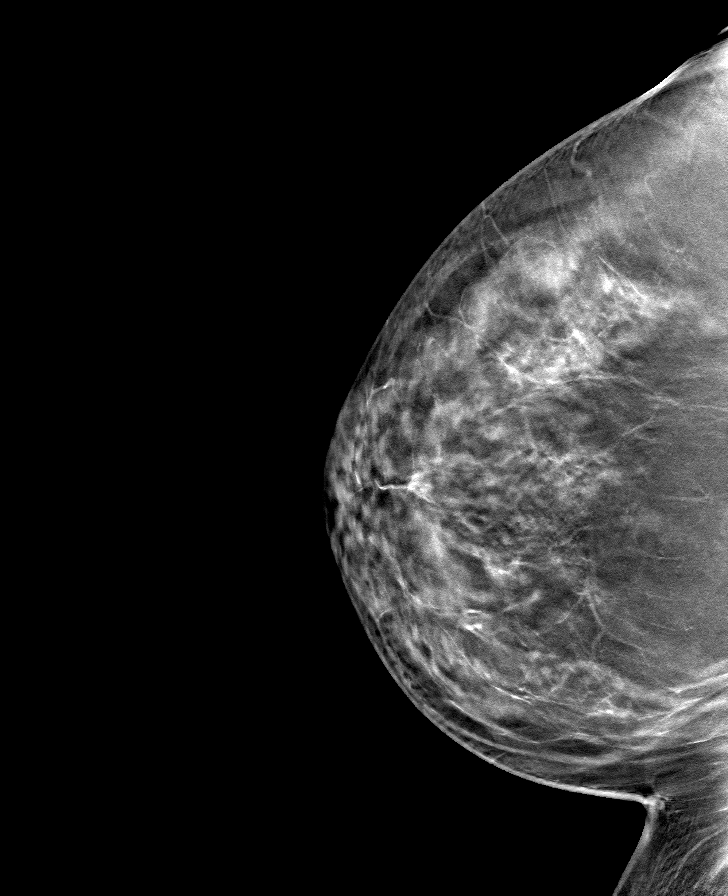

[8 of 24 positions shown; findings below may reference images not displayed]

ACR Breast Density Category c: The breast tissue is heterogeneously
dense, which may obscure small masses.
FINDINGS: There are no findings suspicious for malignancy.
IMPRESSION: No mammographic evidence of malignancy. A result letter of this
screening mammogram will be mailed directly to the patient.

RECOMMENDATION:
Screening mammogram in one year. (Code:[id number])

BI-RADS CATEGORY  1: Negative.

## 2023-01-04 ENCOUNTER — Telehealth: Payer: Self-pay | Admitting: Family Medicine

## 2023-01-04 NOTE — Telephone Encounter (Signed)
Contacted Ersilia Brawley to schedule their annual wellness visit. Call back at later date: 01/05/2023  Patient's going out of town.  Thank you,  [facility] Support [facility] Direct dial  [phone]

## 2023-01-05 NOTE — Telephone Encounter (Signed)
Called patient to schedule Medicare Annual Wellness Visit (AWV). No voicemail available to leave a message.  Last date of AWV: 07/02/2020   Please schedule an AWVS appointment at any time with [facility] VISIT.  If any questions, please contact me at [phone].    Thank you,  [facility] Support [facility] Direct dial  [phone]

## 2023-01-12 ENCOUNTER — Other Ambulatory Visit (INDEPENDENT_AMBULATORY_CARE_PROVIDER_SITE_OTHER): Payer: Medicare HMO

## 2023-01-12 DIAGNOSIS — I10 Essential (primary) hypertension: Secondary | ICD-10-CM

## 2023-01-12 DIAGNOSIS — R7303 Prediabetes: Secondary | ICD-10-CM

## 2023-01-12 DIAGNOSIS — E785 Hyperlipidemia, unspecified: Secondary | ICD-10-CM | POA: Diagnosis not present

## 2023-01-12 DIAGNOSIS — M8580 Other specified disorders of bone density and structure, unspecified site: Secondary | ICD-10-CM

## 2023-01-12 DIAGNOSIS — E041 Nontoxic single thyroid nodule: Secondary | ICD-10-CM

## 2023-01-12 LAB — CBC WITH DIFFERENTIAL/PLATELET
Basophils Absolute: 0 10*3/uL (ref 0.0–0.1)
Basophils Relative: 0.5 % (ref 0.0–3.0)
Eosinophils Absolute: 0.1 10*3/uL (ref 0.0–0.7)
Eosinophils Relative: 3.1 % (ref 0.0–5.0)
HCT: 38.6 % (ref 36.0–46.0)
Hemoglobin: 12.7 g/dL (ref 12.0–15.0)
Lymphocytes Relative: 35.6 % (ref 12.0–46.0)
Lymphs Abs: 1.4 10*3/uL (ref 0.7–4.0)
MCHC: 32.9 g/dL (ref 30.0–36.0)
MCV: 87 fl (ref 78.0–100.0)
Monocytes Absolute: 0.4 10*3/uL (ref 0.1–1.0)
Monocytes Relative: 9.2 % (ref 3.0–12.0)
Neutro Abs: 2 10*3/uL (ref 1.4–7.7)
Neutrophils Relative %: 51.6 % (ref 43.0–77.0)
Platelets: 210 10*3/uL (ref 150.0–400.0)
RBC: 4.44 Mil/uL (ref 3.87–5.11)
RDW: 14.9 % (ref 11.5–15.5)
WBC: 3.8 10*3/uL — ABNORMAL LOW (ref 4.0–10.5)

## 2023-01-12 LAB — COMPREHENSIVE METABOLIC PANEL
ALT: 10 U/L (ref 0–35)
AST: 13 U/L (ref 0–37)
Albumin: 4.1 g/dL (ref 3.5–5.2)
Alkaline Phosphatase: 64 U/L (ref 39–117)
BUN: 16 mg/dL (ref 6–23)
CO2: 28 mEq/L (ref 19–32)
Calcium: 9.1 mg/dL (ref 8.4–10.5)
Chloride: 105 mEq/L (ref 96–112)
Creatinine, Ser: 1.05 mg/dL (ref 0.40–1.20)
GFR: 51.82 mL/min — ABNORMAL LOW (ref >60.00)
Glucose, Bld: 93 mg/dL (ref 70–99)
Potassium: 3.8 mEq/L (ref 3.5–5.1)
Sodium: 141 mEq/L (ref 135–145)
Total Bilirubin: 1.5 mg/dL — ABNORMAL HIGH (ref 0.2–1.2)
Total Protein: 6.4 g/dL (ref 6.0–8.3)

## 2023-01-12 LAB — VITAMIN B12: Vitamin B-12: >1500 pg/mL — ABNORMAL HIGH (ref 211–911)

## 2023-01-12 LAB — HEMOGLOBIN A1C: Hgb A1c MFr Bld: 6.3 % (ref 4.6–6.5)

## 2023-01-12 LAB — LIPID PANEL
Cholesterol: 145 mg/dL (ref 0–200)
HDL: 56.2 mg/dL (ref >39.00)
LDL Cholesterol: 76 mg/dL (ref 0–99)
NonHDL: 88.45
Total CHOL/HDL Ratio: 3
Triglycerides: 63 mg/dL (ref 0.0–149.0)
VLDL: 12.6 mg/dL (ref 0.0–40.0)

## 2023-01-12 LAB — VITAMIN D 25 HYDROXY (VIT D DEFICIENCY, FRACTURES): VITD: 41.08 ng/mL (ref 30.00–100.00)

## 2023-01-12 LAB — TSH: TSH: 0.96 u[IU]/mL (ref 0.35–5.50)

## 2023-01-17 ENCOUNTER — Ambulatory Visit (INDEPENDENT_AMBULATORY_CARE_PROVIDER_SITE_OTHER): Payer: Medicare HMO | Admitting: Family Medicine

## 2023-01-17 ENCOUNTER — Encounter: Payer: Self-pay | Admitting: Family Medicine

## 2023-01-17 VITALS — BP 132/78 | HR 81 | Ht 67.0 in | Wt 198.0 lb

## 2023-01-17 DIAGNOSIS — Z0000 Encounter for general adult medical examination without abnormal findings: Secondary | ICD-10-CM

## 2023-01-17 DIAGNOSIS — I10 Essential (primary) hypertension: Secondary | ICD-10-CM | POA: Diagnosis not present

## 2023-01-17 DIAGNOSIS — Z1211 Encounter for screening for malignant neoplasm of colon: Secondary | ICD-10-CM

## 2023-01-17 DIAGNOSIS — Z1231 Encounter for screening mammogram for malignant neoplasm of breast: Secondary | ICD-10-CM | POA: Diagnosis not present

## 2023-01-17 DIAGNOSIS — E785 Hyperlipidemia, unspecified: Secondary | ICD-10-CM

## 2023-01-17 MED ORDER — TELMISARTAN-HCTZ 80-25 MG PO TABS: 1.0000 | ORAL_TABLET | Freq: Every day | ORAL | 3 refills | Status: DC

## 2023-01-17 MED ORDER — PRAVASTATIN SODIUM 20 MG PO TABS: ORAL_TABLET | ORAL | 3 refills | Status: DC

## 2023-01-17 NOTE — Patient Instructions (Addendum)
It was a pleasure meeting you today. Thank you for allowing me to take part in your health care.  Our goals for today as we discussed include:  Refills sent for requested medications  Recommend Shingles vaccine.  This is a 2 dose series and can be given at your local pharmacy.  Please talk to your pharmacist about this.   Recommend Tetanus Vaccination.  This is given every 10 years.   Recommend Pneumonia Vaccination.    Cologuard ordered for colon cancer screening.  Follow directions.   Follow up in 6 months  If you have any questions or concerns, please do not hesitate to call the office at [phone].  I look forward to our next visit and until then take care and stay safe.  Regards,   Dana Allan, MD   [facility]

## 2023-01-17 NOTE — Progress Notes (Signed)
SUBJECTIVE:   Chief Complaint  Patient presents with   Annual Exam   HPI Patient presents to clinic for annual visit.  No acute concerns today.  Hyperlipidemia Currently on Pravachol 20 mg daily and tolerating well.  Denies any myalgias or joint pain.  Requesting refill for medication.  Hypertension Asymptomatic.  Denies any headaches, visual changes, chest pain, shortness of breath or lower extremity edema.  Takes Micardis HCT 80-25 mg daily and tolerating well.  Requesting refill.    PERTINENT PMH / PSH: Hypertension Hyperlipidemia Osteopenia Prediabetes Thyroid nodule Goiter  Review of Systems - General ROS: negative   OBJECTIVE:  BP 132/78   Pulse 81   Ht 5\' 7"  (1.702 m)   Wt 198 lb (89.8 kg)   SpO2 97%   BMI 31.01 kg/m    Physical Exam Vitals reviewed.  Constitutional:      General: She is not in acute distress.    Appearance: Normal appearance. She is normal weight. She is not ill-appearing, toxic-appearing or diaphoretic.  Eyes:     General:        Right eye: No discharge.        Left eye: No discharge.     Conjunctiva/sclera: Conjunctivae normal.  Neck:     Thyroid: Thyromegaly present. No thyroid tenderness.  Cardiovascular:     Rate and Rhythm: Normal rate and regular rhythm.     Heart sounds: Normal heart sounds.  Pulmonary:     Effort: Pulmonary effort is normal.     Breath sounds: Normal breath sounds.  Abdominal:     General: Bowel sounds are normal.  Musculoskeletal:        General: Normal range of motion.  Skin:    General: Skin is warm and dry.  Neurological:     General: No focal deficit present.     Mental Status: She is alert and oriented to person, place, and time. Mental status is at baseline.  Psychiatric:        Mood and Affect: Mood normal.        Behavior: Behavior normal.        Thought Content: Thought content normal.        Judgment: Judgment normal.       01/17/2023   11:28 AM 08/24/2022    2:19 PM 01/12/2022     9:27 AM 07/02/2020   12:53 PM 06/18/2020   11:19 AM  Depression screen PHQ 2/9  Decreased Interest 0 0 0 0 0  Down, Depressed, Hopeless 0 0 0 0 0  PHQ - 2 Score 0 0 0 0 0  Altered sleeping 0      Tired, decreased energy 0      Change in appetite 0      Feeling bad or failure about yourself  0      Trouble concentrating 0      Moving slowly or fidgety/restless 0      Suicidal thoughts 0      PHQ-9 Score 0      Difficult doing work/chores Not difficult at all            01/17/2023   11:29 AM 12/18/2019   11:09 AM  GAD 7 : Generalized Anxiety Score  Nervous, Anxious, on Edge 0 0  Control/stop worrying 0 0  Worry too much - different things 0 0  Trouble relaxing 0 0  Restless 0 0  Easily annoyed or irritable 0 0  Afraid - awful might happen 0  0  Total GAD 7 Score 0 0  Anxiety Difficulty Not difficult at all Not difficult at all      ASSESSMENT/PLAN:  Annual physical exam Assessment & Plan: HCM Recommend shingles vaccine Recommend pneumonia 20 vaccine Recommend tetanus vaccine. Medicare wellness due Colonoscopy due.  Declined colonoscopy.  Agreed to Cologuard.  Orders placed Mammogram up-to-date.  Due 7/24.  Orders placed. Recommend frequent self breast exams at home.  Clinical breast exams no longer recommended per USPTF guidelines No indication for Pap smear given age greater than 45 Depression/GAD screening negative Follow-up in 1 year for next annual visit.   Essential hypertension -     Telmisartan-HCTZ; Take 1 tablet by mouth daily. In am  Dispense: 90 tablet; Refill: 3  Hyperlipidemia, unspecified hyperlipidemia type Assessment & Plan: Chronic.  Stable.  Tolerating statin therapy.  No myalgias.  Recent LDL at goal  Refill Pravachol 20 mg daily   Orders: -     Pravastatin Sodium; TAKE 1 TABLET(20 MG) BY MOUTH AT BEDTIME  Dispense: 90 tablet; Refill: 3  Colon cancer screening -     Cologuard; Future  Breast cancer screening by mammogram -     3D Screening  Mammogram, Left and Right; Future  Primary hypertension Assessment & Plan: Chronic.  Stable.  Per JNC 8 guidelines at goal for age less than 150/90.   Refill Micardis HCT 80-25 mg daily Continue to monitor blood pressure at home.    PDMP reviewed  Return in about 6 months (around 07/20/2023) for PCP.  Dana Allan, MD

## 2023-01-18 ENCOUNTER — Encounter: Payer: Self-pay | Admitting: Family Medicine

## 2023-01-30 DIAGNOSIS — Z0000 Encounter for general adult medical examination without abnormal findings: Secondary | ICD-10-CM

## 2023-01-30 HISTORY — DX: Encounter for general adult medical examination without abnormal findings: Z00.00

## 2023-01-30 NOTE — Assessment & Plan Note (Signed)
Chronic.  Stable.  Per JNC 8 guidelines at goal for age less than 150/90.   Refill Micardis HCT 80-25 mg daily Continue to monitor blood pressure at home.

## 2023-01-30 NOTE — Assessment & Plan Note (Signed)
HCM Recommend shingles vaccine Recommend pneumonia 20 vaccine Recommend tetanus vaccine. Medicare wellness due Colonoscopy due.  Declined colonoscopy.  Agreed to Cologuard.  Orders placed Mammogram up-to-date.  Due 7/24.  Orders placed. Recommend frequent self breast exams at home.  Clinical breast exams no longer recommended per USPTF guidelines No indication for Pap smear given age greater than 6 Depression/GAD screening negative Follow-up in 1 year for next annual visit.

## 2023-01-30 NOTE — Assessment & Plan Note (Signed)
Chronic.  Stable.  Tolerating statin therapy.  No myalgias.  Recent LDL at goal  Refill Pravachol 20 mg daily

## 2023-04-01 ENCOUNTER — Ambulatory Visit
Admission: RE | Admit: 2023-04-01 | Discharge: 2023-04-01 | Disposition: A | Discharge status: Home / Self Care | Payer: Medicare HMO | Source: Ambulatory Visit | Attending: Family Medicine | Admitting: Family Medicine

## 2023-04-01 DIAGNOSIS — Z1231 Encounter for screening mammogram for malignant neoplasm of breast: Secondary | ICD-10-CM | POA: Insufficient documentation

## 2023-04-05 ENCOUNTER — Telehealth: Payer: Self-pay | Admitting: Family Medicine

## 2023-04-05 ENCOUNTER — Other Ambulatory Visit: Payer: Self-pay | Admitting: Family Medicine

## 2023-04-05 DIAGNOSIS — R928 Other abnormal and inconclusive findings on diagnostic imaging of breast: Secondary | ICD-10-CM

## 2023-04-05 NOTE — Progress Notes (Signed)
LVM for pt to call back for recent mammo results.

## 2023-04-05 NOTE — Progress Notes (Signed)
Diagnostic mammogram and u/s left breast orders  have been place

## 2023-04-05 NOTE — Telephone Encounter (Signed)
Pt is returning a call to the [facility] about mammogram results

## 2023-04-08 ENCOUNTER — Ambulatory Visit
Admission: RE | Admit: 2023-04-08 | Discharge: 2023-04-08 | Disposition: A | Discharge status: Home / Self Care | Payer: Medicare HMO | Source: Ambulatory Visit | Attending: Family Medicine | Admitting: Family Medicine

## 2023-04-08 DIAGNOSIS — R928 Other abnormal and inconclusive findings on diagnostic imaging of breast: Secondary | ICD-10-CM | POA: Insufficient documentation

## 2023-04-08 DIAGNOSIS — R92333 Mammographic heterogeneous density, bilateral breasts: Secondary | ICD-10-CM | POA: Diagnosis not present

## 2023-04-08 DIAGNOSIS — N6320 Unspecified lump in the left breast, unspecified quadrant: Secondary | ICD-10-CM | POA: Diagnosis not present

## 2023-07-20 ENCOUNTER — Ambulatory Visit: Payer: Medicare HMO | Admitting: Family Medicine

## 2023-07-22 ENCOUNTER — Ambulatory Visit: Payer: Medicare HMO | Admitting: Family Medicine

## 2023-07-27 ENCOUNTER — Encounter: Payer: Self-pay | Admitting: Family Medicine

## 2023-07-27 ENCOUNTER — Ambulatory Visit (INDEPENDENT_AMBULATORY_CARE_PROVIDER_SITE_OTHER): Payer: Medicare HMO | Admitting: Family Medicine

## 2023-07-27 VITALS — BP 138/82 | HR 78 | Temp 98.2°F | Resp 16 | Ht 67.0 in | Wt 196.1 lb

## 2023-07-27 DIAGNOSIS — R928 Other abnormal and inconclusive findings on diagnostic imaging of breast: Secondary | ICD-10-CM

## 2023-07-27 DIAGNOSIS — E785 Hyperlipidemia, unspecified: Secondary | ICD-10-CM

## 2023-07-27 DIAGNOSIS — R7303 Prediabetes: Secondary | ICD-10-CM

## 2023-07-27 DIAGNOSIS — E538 Deficiency of other specified B group vitamins: Secondary | ICD-10-CM | POA: Diagnosis not present

## 2023-07-27 DIAGNOSIS — E049 Nontoxic goiter, unspecified: Secondary | ICD-10-CM | POA: Diagnosis not present

## 2023-07-27 DIAGNOSIS — I10 Essential (primary) hypertension: Secondary | ICD-10-CM | POA: Diagnosis not present

## 2023-07-27 NOTE — Patient Instructions (Addendum)
It was a pleasure meeting you today. Thank you for allowing me to take part in your health care.  Our goals for today as we discussed include:  We will get some labs today.  If they are abnormal or we need to do something about them, I will call you.  If they are normal, I will send you a message on MyChart (if it is active) or a letter in the mail.  If you don't hear from [place] in 2 weeks, please call the office at the number below.   Will order imaging of neck They will call you with appointment  Will order mammogram for follow up in January  Recommend Pneumonia 20 vaccine.  One time vaccine.  This is a list of the screening recommended for you and due dates:  Health Maintenance  Topic Date Due   Zoster (Shingles) Vaccine (1 of 2) Never done   Pneumonia Vaccine (1 of 1 - PCV) Never done   Medicare Annual Wellness Visit  07/02/2021   COVID-19 Vaccine (5 - 2023-24 season) 05/15/2023   Flu Shot  12/12/2023*   DEXA scan (bone density measurement)  Completed   Hepatitis C Screening  Completed   HPV Vaccine  Aged Out   DTaP/Tdap/Td vaccine  Discontinued  *Topic was postponed. The date shown is not the original due date.     Follow up in 6 months   If you have any questions or concerns, please do not hesitate to call the office at [phone].  I look forward to our next visit and until then take care and stay safe.  Regards,   Dana Allan, MD   [facility]

## 2023-07-27 NOTE — Progress Notes (Signed)
SUBJECTIVE:   Chief Complaint  Patient presents with   Hypertension   HPI Presents to clinic for follow up high blood pressure  Discussed the use of AI scribe software for clinical note transcription with the patient, who gave verbal consent to proceed.  History of Present Illness The patient, with a history of hypertension and on medication, presents for a six-month follow-up. She reports no issues with her blood pressure control and denies any symptoms of chest pain or shortness of breath. The patient's kidney function was last checked six months ago and was reported as normal. She is also on medication for cholesterol control.  The patient has been experiencing nocturia, but denies any other urinary symptoms. She also reports a high vitamin B12 level, which she attributes to self-increasing her dosage. She has agreed to reduce her intake to once or twice a week.  The patient has a history of a goiter, which she reports has grown significantly in size over the past six months. The growth is causing some discomfort, leading to coughing and ear issues. The patient reports that the size of the goiter fluctuates, often swelling more around the time of her menstrual cycle. She has had one ultrasound of the goiter in the past, which did not show any signs of malignancy. The patient is concerned about the growth and is open to further investigation.  The patient also has a history of fibroids, for which she underwent a hysterectomy. She reports that she has been advised to have diagnostic mammograms going forward due to a possible mass detected in a previous screening. The patient is due for a follow-up mammogram, which has not yet been scheduled. She denies any current breast symptoms.    PERTINENT PMH / PSH: As above  OBJECTIVE:  BP 138/82   Pulse 78   Temp 98.2 F (36.8 C)   Resp 16   Ht 5\' 7"  (1.702 m)   Wt 196 lb 2 oz (89 kg)   SpO2 96%   BMI 30.72 kg/m    Physical  Exam Vitals reviewed.  Constitutional:      General: She is not in acute distress.    Appearance: Normal appearance. She is normal weight. She is not ill-appearing, toxic-appearing or diaphoretic.  Eyes:     General:        Right eye: No discharge.        Left eye: No discharge.     Conjunctiva/sclera: Conjunctivae normal.  Neck:     Thyroid: Thyromegaly present. No thyroid tenderness.     Comments: Increasing right side goiter Cardiovascular:     Rate and Rhythm: Normal rate and regular rhythm.     Heart sounds: Normal heart sounds.  Pulmonary:     Effort: Pulmonary effort is normal.     Breath sounds: Normal breath sounds.  Abdominal:     General: Bowel sounds are normal.  Musculoskeletal:        General: Normal range of motion.  Skin:    General: Skin is warm and dry.  Neurological:     General: No focal deficit present.     Mental Status: She is alert and oriented to person, place, and time. Mental status is at baseline.  Psychiatric:        Mood and Affect: Mood normal.        Behavior: Behavior normal.        Thought Content: Thought content normal.        Judgment: Judgment normal.  07/27/2023    1:44 PM 01/17/2023   11:28 AM 08/24/2022    2:19 PM 01/12/2022    9:27 AM 07/02/2020   12:53 PM  Depression screen PHQ 2/9  Decreased Interest 0 0 0 0 0  Down, Depressed, Hopeless 0 0 0 0 0  PHQ - 2 Score 0 0 0 0 0  Altered sleeping 0 0     Tired, decreased energy 0 0     Change in appetite 0 0     Feeling bad or failure about yourself  0 0     Trouble concentrating 0 0     Moving slowly or fidgety/restless 0 0     Suicidal thoughts 0 0     PHQ-9 Score 0 0     Difficult doing work/chores Not difficult at all Not difficult at all         07/27/2023    1:45 PM 01/17/2023   11:29 AM 12/18/2019   11:09 AM  GAD 7 : Generalized Anxiety Score  Nervous, Anxious, on Edge 0 0 0  Control/stop worrying 0 0 0  Worry too much - different things 0 0 0  Trouble relaxing  0 0 0  Restless 0 0 0  Easily annoyed or irritable 0 0 0  Afraid - awful might happen 0 0 0  Total GAD 7 Score 0 0 0  Anxiety Difficulty Not difficult at all Not difficult at all Not difficult at all    ASSESSMENT/PLAN:  Essential hypertension Assessment & Plan: Well controlled on Micardis  -Continue current medication -Check labs today  Orders: -     Comprehensive metabolic panel  Hyperlipidemia, unspecified hyperlipidemia type Assessment & Plan: Patient on cholesterol medication, which may be contributing to elevated blood sugar levels.   -Continue current medication regimen.     Goiter Assessment & Plan: Noted to have increased in size over the past six months, causing cough and intermittent difficulty swallowing. No pain reported. In no acute distress. Thyroid function normal.   -Order thyroid ultrasound   Orders: -     US THYROID; Future  Vitamin B 12 deficiency Assessment & Plan: High levels of Vitamin B12 noted on recent blood work, likely due to patient self-increasing dosage.   -Advise patient to reduce Vitamin B12 supplementation to once or twice a week.     Abnormal mammogram Assessment & Plan: Recent mammogram showed possible mass, repeat mammogram was done and another is due in six months.   -Order repeat diagnostic mammogram of left breast and ultrasound for January  Orders: -     MM 3D DIAGNOSTIC MAMMOGRAM UNILATERAL LEFT BREAST; Future -     US BREAST COMPLETE UNI LEFT INC AXILLA; Future  Prediabetes Assessment & Plan: Slight increase in Hemoglobin A1c noted.   -Continue to monitor.      PDMP reviewed  Return in about 6 months (around 01/24/2024) for PCP, blood pressure.  Dana Allan, MD

## 2023-07-28 ENCOUNTER — Encounter: Payer: Self-pay | Admitting: Family Medicine

## 2023-07-28 LAB — COMPREHENSIVE METABOLIC PANEL WITH GFR
ALT: 11 U/L (ref 0–35)
AST: 13 U/L (ref 0–37)
Albumin: 4.2 g/dL (ref 3.5–5.2)
Alkaline Phosphatase: 73 U/L (ref 39–117)
BUN: 15 mg/dL (ref 6–23)
CO2: 31 meq/L (ref 19–32)
Calcium: 9.1 mg/dL (ref 8.4–10.5)
Chloride: 106 meq/L (ref 96–112)
Creatinine, Ser: 0.97 mg/dL (ref 0.40–1.20)
GFR: 56.77 mL/min — ABNORMAL LOW
Glucose, Bld: 91 mg/dL (ref 70–99)
Potassium: 3.9 meq/L (ref 3.5–5.1)
Sodium: 142 meq/L (ref 135–145)
Total Bilirubin: 1.7 mg/dL — ABNORMAL HIGH (ref 0.2–1.2)
Total Protein: 6.8 g/dL (ref 6.0–8.3)

## 2023-08-05 ENCOUNTER — Encounter: Payer: Self-pay | Admitting: Family Medicine

## 2023-08-05 DIAGNOSIS — R928 Other abnormal and inconclusive findings on diagnostic imaging of breast: Secondary | ICD-10-CM | POA: Insufficient documentation

## 2023-08-05 DIAGNOSIS — E538 Deficiency of other specified B group vitamins: Secondary | ICD-10-CM | POA: Insufficient documentation

## 2023-08-05 NOTE — Assessment & Plan Note (Signed)
Well controlled on Micardis  -Continue current medication -Check labs today

## 2023-08-05 NOTE — Assessment & Plan Note (Signed)
Noted to have increased in size over the past six months, causing cough and intermittent difficulty swallowing. No pain reported. In no acute distress. Thyroid function normal.   -Order thyroid ultrasound

## 2023-08-05 NOTE — Assessment & Plan Note (Signed)
High levels of Vitamin B12 noted on recent blood work, likely due to patient self-increasing dosage.   -Advise patient to reduce Vitamin B12 supplementation to once or twice a week.

## 2023-08-05 NOTE — Assessment & Plan Note (Addendum)
Recent mammogram showed possible mass, repeat mammogram was done and another is due in six months.   -Order repeat diagnostic mammogram of left breast and ultrasound for January

## 2023-08-05 NOTE — Assessment & Plan Note (Signed)
Patient on cholesterol medication, which may be contributing to elevated blood sugar levels.   -Continue current medication regimen.

## 2023-08-05 NOTE — Assessment & Plan Note (Signed)
Slight increase in Hemoglobin A1c noted.   -Continue to monitor.

## 2023-08-08 ENCOUNTER — Telehealth: Payer: Self-pay | Admitting: Family Medicine

## 2023-08-08 NOTE — Telephone Encounter (Signed)
Lft pt vm to call ofc to sch [place]. Thank you!

## 2023-08-18 ENCOUNTER — Ambulatory Visit
Admission: RE | Admit: 2023-08-18 | Discharge: 2023-08-18 | Disposition: A | Discharge status: Home / Self Care | Payer: Medicare HMO | Source: Ambulatory Visit | Attending: Family Medicine | Admitting: Family Medicine

## 2023-08-18 DIAGNOSIS — E042 Nontoxic multinodular goiter: Secondary | ICD-10-CM | POA: Diagnosis not present

## 2023-08-18 DIAGNOSIS — R221 Localized swelling, mass and lump, neck: Secondary | ICD-10-CM | POA: Diagnosis not present

## 2023-08-18 DIAGNOSIS — E049 Nontoxic goiter, unspecified: Secondary | ICD-10-CM | POA: Insufficient documentation

## 2023-08-22 ENCOUNTER — Other Ambulatory Visit: Payer: Self-pay

## 2023-08-22 DIAGNOSIS — E041 Nontoxic single thyroid nodule: Secondary | ICD-10-CM

## 2023-08-30 DIAGNOSIS — E042 Nontoxic multinodular goiter: Secondary | ICD-10-CM | POA: Diagnosis not present

## 2023-09-12 ENCOUNTER — Other Ambulatory Visit: Payer: Self-pay | Admitting: Otolaryngology

## 2023-09-12 DIAGNOSIS — E041 Nontoxic single thyroid nodule: Secondary | ICD-10-CM

## 2023-09-15 NOTE — Progress Notes (Signed)
 Patient for US  guided FNA RT Mid Thyroid  Nodule Biopsy & US  FNA Aspiration on Friday 09/16/23, I called and spoke with the patient on the phone and gave pre-procedure instructions. Pt was made aware to be here at 2p and check in at the [facility] registration desk. Pt stated understanding.  Called 09/15/2023

## 2023-09-16 ENCOUNTER — Ambulatory Visit
Admission: RE | Admit: 2023-09-16 | Discharge: 2023-09-16 | Disposition: A | Discharge status: Home / Self Care | Payer: Medicare HMO | Source: Ambulatory Visit | Attending: Otolaryngology | Admitting: Otolaryngology

## 2023-09-16 DIAGNOSIS — E041 Nontoxic single thyroid nodule: Secondary | ICD-10-CM | POA: Insufficient documentation

## 2023-09-16 DIAGNOSIS — E079 Disorder of thyroid, unspecified: Secondary | ICD-10-CM | POA: Diagnosis not present

## 2023-09-16 MED ORDER — LIDOCAINE HCL (PF) 1 % IJ SOLN
20.0000 mL | Freq: Once | INTRAMUSCULAR | Status: AC
  Administered 2023-09-16: 20 mL via INTRADERMAL
  Filled 2023-09-16: qty 20

## 2023-09-21 LAB — CYTOLOGY - NON PAP

## 2023-09-30 DIAGNOSIS — D440 Neoplasm of uncertain behavior of thyroid gland: Secondary | ICD-10-CM | POA: Diagnosis not present

## 2023-10-03 ENCOUNTER — Encounter: Payer: Self-pay | Admitting: Student

## 2023-10-11 DIAGNOSIS — H52222 Regular astigmatism, left eye: Secondary | ICD-10-CM | POA: Diagnosis not present

## 2023-10-11 DIAGNOSIS — H25013 Cortical age-related cataract, bilateral: Secondary | ICD-10-CM | POA: Diagnosis not present

## 2023-10-11 DIAGNOSIS — H524 Presbyopia: Secondary | ICD-10-CM | POA: Diagnosis not present

## 2023-10-18 ENCOUNTER — Ambulatory Visit
Admission: RE | Admit: 2023-10-18 | Discharge: 2023-10-18 | Disposition: A | Discharge status: Home / Self Care | Payer: Medicare HMO | Source: Ambulatory Visit | Attending: Family Medicine | Admitting: Family Medicine

## 2023-10-18 DIAGNOSIS — R92333 Mammographic heterogeneous density, bilateral breasts: Secondary | ICD-10-CM | POA: Diagnosis not present

## 2023-10-18 DIAGNOSIS — R928 Other abnormal and inconclusive findings on diagnostic imaging of breast: Secondary | ICD-10-CM | POA: Diagnosis not present

## 2023-11-08 DIAGNOSIS — D440 Neoplasm of uncertain behavior of thyroid gland: Secondary | ICD-10-CM | POA: Diagnosis not present

## 2023-11-11 ENCOUNTER — Other Ambulatory Visit: Payer: Self-pay | Admitting: Otolaryngology

## 2023-11-15 DIAGNOSIS — D440 Neoplasm of uncertain behavior of thyroid gland: Secondary | ICD-10-CM | POA: Diagnosis not present

## 2023-11-16 ENCOUNTER — Telehealth: Payer: Self-pay

## 2023-11-16 NOTE — Telephone Encounter (Signed)
 Left message to return call to our office.  Called pt to get her scheduled for surgical clearance.

## 2023-11-17 ENCOUNTER — Encounter: Payer: Self-pay | Admitting: Nurse Practitioner

## 2023-11-17 ENCOUNTER — Ambulatory Visit: Admitting: Family Medicine

## 2023-11-17 ENCOUNTER — Ambulatory Visit: Admitting: Nurse Practitioner

## 2023-11-17 ENCOUNTER — Telehealth: Payer: Self-pay

## 2023-11-17 VITALS — BP 122/78 | HR 63 | Temp 98.3°F | Ht 67.0 in | Wt 192.8 lb

## 2023-11-17 DIAGNOSIS — Z01818 Encounter for other preprocedural examination: Secondary | ICD-10-CM

## 2023-11-17 HISTORY — DX: Encounter for other preprocedural examination: Z01.818

## 2023-11-17 LAB — CBC
HCT: 40 % (ref 36.0–46.0)
Hemoglobin: 12.8 g/dL (ref 12.0–15.0)
MCHC: 32 g/dL (ref 30.0–36.0)
MCV: 89.1 fl (ref 78.0–100.0)
Platelets: 223 10*3/uL (ref 150.0–400.0)
RBC: 4.49 Mil/uL (ref 3.87–5.11)
RDW: 14.7 % (ref 11.5–15.5)
WBC: 3.9 10*3/uL — ABNORMAL LOW (ref 4.0–10.5)

## 2023-11-17 LAB — BASIC METABOLIC PANEL
BUN: 14 mg/dL (ref 6–23)
CO2: 30 meq/L (ref 19–32)
Calcium: 9.4 mg/dL (ref 8.4–10.5)
Chloride: 104 meq/L (ref 96–112)
Creatinine, Ser: 0.93 mg/dL (ref 0.40–1.20)
GFR: 59.58 mL/min — ABNORMAL LOW (ref >60.00)
Glucose, Bld: 102 mg/dL — ABNORMAL HIGH (ref 70–99)
Potassium: 3.8 meq/L (ref 3.5–5.1)
Sodium: 141 meq/L (ref 135–145)

## 2023-11-17 LAB — HEPATIC FUNCTION PANEL
ALT: 11 U/L (ref 0–35)
AST: 13 U/L (ref 0–37)
Albumin: 4.2 g/dL (ref 3.5–5.2)
Alkaline Phosphatase: 68 U/L (ref 39–117)
Bilirubin, Direct: 0.2 mg/dL (ref 0.0–0.3)
Total Bilirubin: 1.3 mg/dL — ABNORMAL HIGH (ref 0.2–1.2)
Total Protein: 6.9 g/dL (ref 6.0–8.3)

## 2023-11-17 NOTE — Telephone Encounter (Signed)
 Copied from CRM [id number]. Topic: General - Other >> Nov 17, 2023 10:10 AM Eunice Blase wrote: Reason for CRM: Received call from [facility] per Sam Ph:[phone] [phone] on 11/15/2023 for medical clearance for surgery on 12/06/2023.Marland Kitchen Please call or fax.

## 2023-11-17 NOTE — Assessment & Plan Note (Addendum)
 No reported chest pain, shortness of breath, or mobility issues. Blood pressure well-controlled on current medication. No use of blood thinners. -EKG sinus rhythm with no acute ischemic changes when compared to previous EKG 12/29/17. -Blood work stable. -Continue telmisartan-hydrochlorothiazide and pravastatin. -Will need close intra op and post op monitoring of heart rate and blood pressure to avoid extremes.  -Discussed with PCP Dr. Dana Allan.

## 2023-11-17 NOTE — Patient Instructions (Signed)
 Please go to the lab for blood work

## 2023-11-17 NOTE — Progress Notes (Signed)
 Established Patient Office Visit  Subjective:  Patient ID: Stacy Vasquez, female    DOB: [birth year]  Age: 78 y.o. MRN: [id number]  CC:  Chief Complaint  Patient presents with   Acute Visit    Surgical clearance- thyroid removal 3/25   Discussed the use of a AI scribe software for clinical note transcription with the patient, who gave verbal consent to proceed.  HPI  Stacy Vasquez is a 78 year old female who presents for preoperative evaluation for thyroid surgery.  She is scheduled for thyroid surgery and is undergoing a preoperative evaluation. No symptoms of shortness of breath, chest pain, dizziness, or difficulty with physical activities such as walking or climbing stairs. She is not on any blood thinners.  She has a history of hypertension, managed with medication since her late fifties. Blood pressure readings are generally around 130/80 mmHg. Current medications include antihypertensive and cholesterol medications, as well as vitamins D and a multivitamin.  HPI   Past Medical History:  Diagnosis Date   Bilateral hip pain 01/05/2021   Blood transfusion without reported diagnosis    Cataract    ? which eye wears glasses    Chronic pain of right knee 08/14/2019   Heel pain 05/09/2018   Hip arthritis 01/05/2021   Mild to moderate left and mild right xray 01/01/21   Hyperlipidemia    Hypertension    Pain of right midfoot 05/09/2018   Prediabetes    Thyroid enlargement 12/29/2017   Upper respiratory infection, viral 07/05/2022    Past Surgical History:  Procedure Laterality Date   ABDOMINAL HYSTERECTOMY     over 30 years fibroids no h/o abnormal pap     Family History  Problem Relation Age of Onset   Arthritis Mother    Diabetes Mother    Hypertension Mother    Alcohol abuse Father    Arthritis Father    Dementia Father    Stroke Sister    Congestive Heart Failure Sister    Other Sister        09/13/19 covid 19+   Leukemia Sister        ? 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 [place]    No kids, 5 siblings she is 3rd of 5 children    Social Drivers of Corporate investment banker Strain: Low Risk  (07/02/2020)   Overall Financial Resource Strain (CARDIA)    Difficulty of Paying Living Expenses: Not hard at all  Food Insecurity: No Food Insecurity (07/02/2020)   Hunger Vital Sign    Worried About Running Out of Food in the Last Year: Never true    Ran Out of Food in the Last Year:  Never true  Transportation Needs: No Transportation Needs (07/02/2020)   PRAPARE - Administrator, Civil Service (Medical): No    Lack of Transportation (Non-Medical): No  Physical Activity: Not on file  Stress: No Stress Concern Present (07/02/2020)   [employer] of Occupational Health - Occupational Stress Questionnaire    Feeling of Stress : Not at all  Social Connections: Not on file  Intimate Partner Violence: Not on file     Outpatient Medications Prior to Visit  Medication Sig Dispense Refill   Cholecalciferol (VITAMIN D3) 1000 units CAPS Take by mouth.     cyanocobalamin 1000 MCG tablet Take 2,000 mcg by mouth daily.     Multiple Vitamins-Minerals (MULTIVITAMIN WOMEN) TABS Take by mouth daily.     pravastatin (PRAVACHOL) 20 MG tablet TAKE 1 TABLET(20 MG) BY MOUTH AT BEDTIME 90 tablet 3    telmisartan-hydrochlorothiazide (MICARDIS HCT) 80-25 MG tablet Take 1 tablet by mouth daily. In am 90 tablet 3   No facility-administered medications prior to visit.    Allergies  Allergen Reactions   Lipitor [Atorvastatin Calcium]     feet, hands, ankles swelling per pt     Penicillins     Vaginal infections    ROS Review of Systems Negative unless indicated in HPI.    Objective:    Physical Exam Constitutional:      Appearance: Normal appearance.  HENT:     Right Ear: Tympanic membrane normal.     Left Ear: Tympanic membrane normal.     Mouth/Throat:     Mouth: Mucous membranes are moist.  Eyes:     Conjunctiva/sclera: Conjunctivae normal.     Pupils: Pupils are equal, round, and reactive to light.  Cardiovascular:     Rate and Rhythm: Normal rate and regular rhythm.     Pulses: Normal pulses.     Heart sounds: Normal heart sounds.  Pulmonary:     Effort: Pulmonary effort is normal.     Breath sounds: Normal breath sounds.  Abdominal:     General: Bowel sounds are normal.     Palpations: Abdomen is soft.  Musculoskeletal:     Cervical back: Normal range of motion. No tenderness.  Skin:    General: Skin is warm.     Findings: No bruising.  Neurological:     General: No focal deficit present.     Mental Status: She is alert and oriented to person, place, and time. Mental status is at baseline.  Psychiatric:        Mood and Affect: Mood normal.        Behavior: Behavior normal.        Thought Content: Thought content normal.        Judgment: Judgment normal.     BP 122/78   Pulse 63   Temp 98.3 F (36.8 C)   Ht 5\' 7"  (1.702 m)   Wt 192 lb 12.8 oz (87.5 kg)   SpO2 98%   BMI 30.20 kg/m  Wt Readings from Last 3 Encounters:  11/17/23 192 lb 12.8 oz (87.5 kg)  07/27/23 196 lb 2 oz (89 kg)  01/17/23 198 lb (89.8 kg)     Health Maintenance  Topic Date Due   Zoster Vaccines- Shingrix (1 of 2) Never done   Medicare Annual Wellness (AWV)  07/02/2021    COVID-19 Vaccine (5 - 2024-25 season) 05/15/2023   INFLUENZA VACCINE  12/12/2023 (Originally 04/14/2023)   Pneumonia Vaccine 6+ Years old (1 of 1 -  PCV) 08/04/2024 (Originally 01/31/2012)   DEXA SCAN  Completed   Hepatitis C Screening  Completed   HPV VACCINES  Aged Out   DTaP/Tdap/Td  Discontinued    There are no preventive care reminders to display for this patient.  Lab Results  Component Value Date   TSH 0.96 01/12/2023   Lab Results  Component Value Date   WBC 3.9 (L) 11/17/2023   HGB 12.8 11/17/2023   HCT 40.0 11/17/2023   MCV 89.1 11/17/2023   PLT 223.0 11/17/2023   Lab Results  Component Value Date   NA 141 11/17/2023   K 3.8 11/17/2023   CO2 30 11/17/2023   GLUCOSE 102 (H) 11/17/2023   BUN 14 11/17/2023   CREATININE 0.93 11/17/2023   BILITOT 1.3 (H) 11/17/2023   ALKPHOS 68 11/17/2023   AST 13 11/17/2023   ALT 11 11/17/2023   PROT 6.9 11/17/2023   ALBUMIN 4.2 11/17/2023   CALCIUM 9.4 11/17/2023   GFR 59.58 (L) 11/17/2023   Lab Results  Component Value Date   CHOL 145 01/12/2023   Lab Results  Component Value Date   HDL 56.20 01/12/2023   Lab Results  Component Value Date   LDLCALC 76 01/12/2023   Lab Results  Component Value Date   TRIG 63.0 01/12/2023   Lab Results  Component Value Date   CHOLHDL 3 01/12/2023   Lab Results  Component Value Date   HGBA1C 6.3 01/12/2023      Assessment & Plan:  Pre-op evaluation Assessment & Plan: No reported chest pain, shortness of breath, or mobility issues. Blood pressure well-controlled on current medication. No use of blood thinners. -EKG sinus rhythm with no acute ischemic changes when compared to previous EKG 12/29/17. -Blood work stable. -Continue telmisartan-hydrochlorothiazide and pravastatin. -Will need close intra op and post op monitoring of heart rate and blood pressure to avoid extremes.  -Discussed with PCP Dr. Dana Allan.  Orders: -     EKG 12-Lead -     Hepatic function panel -      Basic metabolic panel -     CBC    Follow-up: No follow-ups on file.   Kara Dies, NP

## 2023-11-18 NOTE — Progress Notes (Signed)
 Please inform pt: The blood work is stable.

## 2023-11-28 ENCOUNTER — Inpatient Hospital Stay
Admission: RE | Admit: 2023-11-28 | Discharge: 2023-11-28 | Disposition: A | Discharge status: Home / Self Care | Source: Ambulatory Visit

## 2023-11-29 ENCOUNTER — Encounter
Admission: RE | Admit: 2023-11-29 | Discharge: 2023-11-29 | Disposition: A | Discharge status: Home / Self Care | Source: Ambulatory Visit | Attending: Otolaryngology | Admitting: Otolaryngology

## 2023-11-29 ENCOUNTER — Other Ambulatory Visit: Payer: Self-pay

## 2023-11-29 ENCOUNTER — Other Ambulatory Visit

## 2023-11-29 HISTORY — DX: Anemia, unspecified: D64.9

## 2023-11-29 HISTORY — DX: Prediabetes: R73.03

## 2023-11-29 NOTE — Patient Instructions (Addendum)
 Your procedure is scheduled on: 12/06/23 - Tuesday Report to the Registration Desk on the 1st floor of the [facility]. To find out your arrival time, please call [phone] between 1PM - 3PM on: 12/05/23 - Monday If your arrival time is 6:00 am, do not arrive before that time as the [facility] entrance doors do not open until 6:00 am.  REMEMBER: Instructions that are not followed completely may result in serious medical risk, up to and including death; or upon the discretion of your surgeon and anesthesiologist your surgery may need to be rescheduled.  Do not eat food after midnight the night before surgery.  No gum chewing or hard candies.  You may however, drink CLEAR liquids up to 2 hours before you are scheduled to arrive for your surgery. Do not drink anything within 2 hours of your scheduled arrival time.  Clear liquids include: - water  - apple juice without pulp - gatorade (not RED colors) - black coffee or tea (Do NOT add milk or creamers to the coffee or tea) Do NOT drink anything that is not on this list.   One week prior to surgery: Stop Anti-inflammatories (NSAIDS) such as Advil, Aleve, Ibuprofen, Motrin, Naproxen, Naprosyn and Aspirin based products such as Excedrin, Goody's Powder, BC Powder. You may take Tylenol if needed for pain up until the day of surgery.  Stop ANY OVER THE COUNTER supplements until after surgery :   HOLD telmisartan-hydrochlorothiazide on the day of surgery  ON THE DAY OF SURGERY ONLY TAKE THESE MEDICATIONS WITH SIPS OF WATER:  calcium ER 600 mg - D3 12.5 mcg    No Alcohol for 24 hours before or after surgery.  No Smoking including e-cigarettes for 24 hours before surgery.  No chewable tobacco products for at least 6 hours before surgery.  No nicotine patches on the day of surgery.  Do not use any "recreational" drugs for at least a week (preferably 2 weeks) before your surgery.  Please be advised that the combination of cocaine  and anesthesia may have negative outcomes, up to and including death. If you test positive for cocaine, your surgery will be cancelled.  On the morning of surgery brush your teeth with toothpaste and water, you may rinse your mouth with mouthwash if you wish. Do not swallow any toothpaste or mouthwash.  Use CHG Soap or wipes as directed on instruction sheet.  Do not wear jewelry, make-up, hairpins, clips or nail polish.  For welded (permanent) jewelry: bracelets, anklets, waist bands, etc.  Please have this removed prior to surgery.  If it is not removed, there is a chance that hospital personnel will need to cut it off on the day of surgery.  Do not wear lotions, powders, or perfumes.   Do not shave body hair from the neck down 48 hours before surgery.  Contact lenses, hearing aids and dentures may not be worn into surgery.  Do not bring valuables to the hospital. [facility] is not responsible for any missing/lost belongings or valuables.   Notify your doctor if there is any change in your medical condition (cold, fever, infection).  Wear comfortable clothing (specific to your surgery type) to the hospital.  After surgery, you can help prevent lung complications by doing breathing exercises.  Take deep breaths and cough every 1-2 hours. Your doctor may order a device called an Incentive Spirometer to help you take deep breaths.  When coughing or sneezing, hold a pillow firmly against your incision with  both hands. This is called "splinting." Doing this helps protect your incision. It also decreases belly discomfort.  If you are being admitted to the hospital overnight, leave your suitcase in the car. After surgery it may be brought to your room.  In case of increased patient census, it may be necessary for you, the patient, to continue your postoperative care in the Same Day Surgery department.  If you are being discharged the day of surgery, you will not be allowed to drive  home. You will need a responsible individual to drive you home and stay with you for 24 hours after surgery.   If you are taking public transportation, you will need to have a responsible individual with you.  Please call the Pre-admissions Testing Dept. at [phone] if you have any questions about these instructions.  Surgery Visitation Policy:  Patients having surgery or a procedure may have two visitors.  Children under the age of 35 must have an adult with them who is not the patient.  Temporary Visitor Restrictions Due to increasing cases of flu, RSV and COVID-19: Children ages 27 and under will not be able to visit patients in [facility] hospitals under most circumstances.  Inpatient Visitation:    Visiting hours are 7 a.m. to 8 p.m. Up to four visitors are allowed at one time in a patient room. The visitors may rotate out with other people during the day.  One visitor age 62 or older may stay with the patient overnight and must be in the room by 8 p.m.

## 2023-12-05 MED ORDER — CHLORHEXIDINE GLUCONATE 0.12 % MT SOLN
15.0000 mL | Freq: Once | OROMUCOSAL | Status: AC
  Administered 2023-12-06: 15 mL via OROMUCOSAL

## 2023-12-05 MED ORDER — ORAL CARE MOUTH RINSE: 15.0000 mL | Freq: Once | OROMUCOSAL | Status: AC

## 2023-12-05 MED ORDER — LACTATED RINGERS IV SOLN: INTRAVENOUS | Status: DC

## 2023-12-06 ENCOUNTER — Encounter: Payer: Self-pay | Admitting: Otolaryngology

## 2023-12-06 ENCOUNTER — Other Ambulatory Visit: Payer: Self-pay

## 2023-12-06 ENCOUNTER — Ambulatory Visit: Payer: Self-pay

## 2023-12-06 ENCOUNTER — Observation Stay
Admission: RE | Admit: 2023-12-06 | Discharge: 2023-12-07 | Disposition: A | Discharge status: Home / Self Care | Payer: Medicare HMO | Attending: Otolaryngology | Admitting: Otolaryngology

## 2023-12-06 ENCOUNTER — Encounter
Admission: RE | Disposition: A | Discharge status: Home / Self Care | Payer: Self-pay | Source: Home / Self Care | Attending: Otolaryngology

## 2023-12-06 DIAGNOSIS — C73 Malignant neoplasm of thyroid gland: Secondary | ICD-10-CM | POA: Diagnosis not present

## 2023-12-06 DIAGNOSIS — D440 Neoplasm of uncertain behavior of thyroid gland: Secondary | ICD-10-CM | POA: Diagnosis not present

## 2023-12-06 DIAGNOSIS — E890 Postprocedural hypothyroidism: Principal | ICD-10-CM

## 2023-12-06 HISTORY — PX: KENALOG INJECTION: SHX5298

## 2023-12-06 HISTORY — PX: THYROIDECTOMY: SHX17

## 2023-12-06 HISTORY — PX: CONTINUOUS NERVE MONITORING: SHX6650

## 2023-12-06 LAB — CALCIUM
Calcium: 8.9 mg/dL (ref 8.9–10.3)
Calcium: 9.5 mg/dL (ref 8.9–10.3)

## 2023-12-06 LAB — ALBUMIN: Albumin: 3.9 g/dL (ref 3.5–5.0)

## 2023-12-06 LAB — MAGNESIUM: Magnesium: 1.9 mg/dL (ref 1.7–2.4)

## 2023-12-06 SURGERY — THYROIDECTOMY
Anesthesia: General | Site: Neck

## 2023-12-06 MED ORDER — ONDANSETRON HCL 4 MG PO TABS: 4.0000 mg | ORAL_TABLET | ORAL | Status: DC | PRN

## 2023-12-06 MED ORDER — PHENYLEPHRINE HCL-NACL 20-0.9 MG/250ML-% IV SOLN
INTRAVENOUS | Status: DC | PRN
  Administered 2023-12-06: 40 ug/min via INTRAVENOUS

## 2023-12-06 MED ORDER — SODIUM CHLORIDE 0.9% FLUSH
3.0000 mL | Freq: Two times a day (BID) | INTRAVENOUS | Status: DC
  Administered 2023-12-06 (×2): 10 mL via INTRAVENOUS

## 2023-12-06 MED ORDER — ONDANSETRON HCL 4 MG/2ML IJ SOLN
INTRAMUSCULAR | Status: DC | PRN
  Administered 2023-12-06: 4 mg via INTRAVENOUS

## 2023-12-06 MED ORDER — PROPOFOL 10 MG/ML IV BOLUS
INTRAVENOUS | Status: AC
  Filled 2023-12-06: qty 20

## 2023-12-06 MED ORDER — FENTANYL CITRATE (PF) 100 MCG/2ML IJ SOLN
INTRAMUSCULAR | Status: AC
  Filled 2023-12-06: qty 2

## 2023-12-06 MED ORDER — ALBUMIN HUMAN 5 % IV SOLN
INTRAVENOUS | Status: AC
Start: 2023-12-06 — End: ?
  Filled 2023-12-06: qty 250

## 2023-12-06 MED ORDER — PHENYLEPHRINE 80 MCG/ML (10ML) SYRINGE FOR IV PUSH (FOR BLOOD PRESSURE SUPPORT)
PREFILLED_SYRINGE | INTRAVENOUS | Status: DC | PRN
  Administered 2023-12-06: 80 ug via INTRAVENOUS

## 2023-12-06 MED ORDER — PROPOFOL 1000 MG/100ML IV EMUL
INTRAVENOUS | Status: AC
  Filled 2023-12-06: qty 100

## 2023-12-06 MED ORDER — MORPHINE SULFATE (PF) 2 MG/ML IV SOLN: 2.0000 mg | INTRAVENOUS | Status: DC | PRN

## 2023-12-06 MED ORDER — SUCCINYLCHOLINE CHLORIDE 200 MG/10ML IV SOSY
PREFILLED_SYRINGE | INTRAVENOUS | Status: AC
  Filled 2023-12-06: qty 10

## 2023-12-06 MED ORDER — SUCCINYLCHOLINE CHLORIDE 200 MG/10ML IV SOSY
PREFILLED_SYRINGE | INTRAVENOUS | Status: DC | PRN
  Administered 2023-12-06: 100 mg via INTRAVENOUS

## 2023-12-06 MED ORDER — PHENYLEPHRINE HCL-NACL 20-0.9 MG/250ML-% IV SOLN
INTRAVENOUS | Status: AC
Start: 2023-12-06 — End: ?
  Filled 2023-12-06: qty 250

## 2023-12-06 MED ORDER — PROPOFOL 500 MG/50ML IV EMUL
INTRAVENOUS | Status: DC | PRN
  Administered 2023-12-06: 160 ug/kg/min via INTRAVENOUS
  Administered 2023-12-06: 100 ug/kg/min via INTRAVENOUS

## 2023-12-06 MED ORDER — GLYCOPYRROLATE 0.2 MG/ML IJ SOLN
INTRAMUSCULAR | Status: DC | PRN
  Administered 2023-12-06: .1 mg via INTRAVENOUS

## 2023-12-06 MED ORDER — OXYCODONE HCL 5 MG PO TABS
5.0000 mg | ORAL_TABLET | Freq: Once | ORAL | Status: AC | PRN
  Administered 2023-12-06: 5 mg via ORAL

## 2023-12-06 MED ORDER — DOCUSATE SODIUM 100 MG PO CAPS
100.0000 mg | ORAL_CAPSULE | Freq: Two times a day (BID) | ORAL | Status: DC
  Administered 2023-12-06 – 2023-12-07 (×3): 100 mg via ORAL
  Filled 2023-12-06 (×3): qty 1

## 2023-12-06 MED ORDER — LIDOCAINE HCL (PF) 2 % IJ SOLN
INTRAMUSCULAR | Status: AC
  Filled 2023-12-06: qty 5

## 2023-12-06 MED ORDER — FENTANYL CITRATE (PF) 100 MCG/2ML IJ SOLN: 25.0000 ug | INTRAMUSCULAR | Status: DC | PRN

## 2023-12-06 MED ORDER — ACETAMINOPHEN 10 MG/ML IV SOLN: 1000.0000 mg | Freq: Once | INTRAVENOUS | Status: DC | PRN

## 2023-12-06 MED ORDER — DEXAMETHASONE SODIUM PHOSPHATE 10 MG/ML IJ SOLN
INTRAMUSCULAR | Status: DC | PRN
  Administered 2023-12-06: 10 mg via INTRAVENOUS

## 2023-12-06 MED ORDER — CALCIUM CARBONATE 1250 (500 CA) MG PO TABS
1000.0000 mg | ORAL_TABLET | Freq: Three times a day (TID) | ORAL | Status: DC
  Administered 2023-12-06 – 2023-12-07 (×3): 2500 mg via ORAL
  Filled 2023-12-06 (×5): qty 2

## 2023-12-06 MED ORDER — BUPIVACAINE-EPINEPHRINE (PF) 0.25% -1:200000 IJ SOLN
INTRAMUSCULAR | Status: DC | PRN
  Administered 2023-12-06: 7 mL

## 2023-12-06 MED ORDER — BUPIVACAINE-EPINEPHRINE (PF) 0.25% -1:200000 IJ SOLN
INTRAMUSCULAR | Status: AC
  Filled 2023-12-06: qty 30

## 2023-12-06 MED ORDER — TELMISARTAN-HCTZ 80-25 MG PO TABS: 1.0000 | ORAL_TABLET | Freq: Every day | ORAL | Status: DC

## 2023-12-06 MED ORDER — PHENYLEPHRINE 80 MCG/ML (10ML) SYRINGE FOR IV PUSH (FOR BLOOD PRESSURE SUPPORT)
PREFILLED_SYRINGE | INTRAVENOUS | Status: AC
  Filled 2023-12-06: qty 10

## 2023-12-06 MED ORDER — OXYCODONE HCL 5 MG/5ML PO SOLN: 5.0000 mg | Freq: Once | ORAL | Status: AC | PRN

## 2023-12-06 MED ORDER — LIDOCAINE HCL (PF) 2 % IJ SOLN
INTRAMUSCULAR | Status: DC | PRN
  Administered 2023-12-06: 100 mg via INTRADERMAL

## 2023-12-06 MED ORDER — 0.9 % SODIUM CHLORIDE (POUR BTL) OPTIME
TOPICAL | Status: DC | PRN
  Administered 2023-12-06 (×2): 500 mL

## 2023-12-06 MED ORDER — REMIFENTANIL HCL 1 MG IV SOLR
INTRAVENOUS | Status: AC
  Filled 2023-12-06: qty 1000

## 2023-12-06 MED ORDER — ACETAMINOPHEN 650 MG RE SUPP: 650.0000 mg | RECTAL | Status: DC | PRN

## 2023-12-06 MED ORDER — PHENYLEPHRINE HCL-NACL 20-0.9 MG/250ML-% IV SOLN
INTRAVENOUS | Status: AC
  Filled 2023-12-06: qty 250

## 2023-12-06 MED ORDER — ONDANSETRON HCL 4 MG/2ML IJ SOLN: 4.0000 mg | INTRAMUSCULAR | Status: DC | PRN

## 2023-12-06 MED ORDER — ONDANSETRON HCL 4 MG/2ML IJ SOLN
INTRAMUSCULAR | Status: AC
  Filled 2023-12-06: qty 2

## 2023-12-06 MED ORDER — TRIAMCINOLONE ACETONIDE 40 MG/ML IJ SUSP
INTRAMUSCULAR | Status: AC
  Filled 2023-12-06: qty 1

## 2023-12-06 MED ORDER — GLYCOPYRROLATE 0.2 MG/ML IJ SOLN
INTRAMUSCULAR | Status: AC
  Filled 2023-12-06: qty 1

## 2023-12-06 MED ORDER — OXYCODONE HCL 5 MG PO TABS
ORAL_TABLET | ORAL | Status: AC
Start: 2023-12-06 — End: ?
  Filled 2023-12-06: qty 1

## 2023-12-06 MED ORDER — ACETAMINOPHEN 10 MG/ML IV SOLN
INTRAVENOUS | Status: AC
  Filled 2023-12-06: qty 100

## 2023-12-06 MED ORDER — DEXAMETHASONE SODIUM PHOSPHATE 10 MG/ML IJ SOLN
INTRAMUSCULAR | Status: AC
  Filled 2023-12-06: qty 1

## 2023-12-06 MED ORDER — PRAVASTATIN SODIUM 20 MG PO TABS
20.0000 mg | ORAL_TABLET | Freq: Every day | ORAL | Status: DC
  Administered 2023-12-06: 20 mg via ORAL
  Filled 2023-12-06: qty 1

## 2023-12-06 MED ORDER — SODIUM CHLORIDE 0.9% FLUSH: 3.0000 mL | INTRAVENOUS | Status: DC | PRN

## 2023-12-06 MED ORDER — TRIAMCINOLONE ACETONIDE 40 MG/ML IJ SUSP
INTRAMUSCULAR | Status: DC | PRN
  Administered 2023-12-06: 40 mg

## 2023-12-06 MED ORDER — IRBESARTAN 150 MG PO TABS
300.0000 mg | ORAL_TABLET | Freq: Every day | ORAL | Status: DC
  Administered 2023-12-07: 300 mg via ORAL
  Filled 2023-12-06: qty 2

## 2023-12-06 MED ORDER — FENTANYL CITRATE (PF) 100 MCG/2ML IJ SOLN
INTRAMUSCULAR | Status: DC | PRN
  Administered 2023-12-06: 25 ug via INTRAVENOUS

## 2023-12-06 MED ORDER — REMIFENTANIL HCL 1 MG IV SOLR
INTRAVENOUS | Status: DC | PRN
Start: 2023-12-06 — End: 2023-12-06
  Administered 2023-12-06: .05 ug/kg/min via INTRAVENOUS

## 2023-12-06 MED ORDER — HYDROCODONE-ACETAMINOPHEN 7.5-325 MG/15ML PO SOLN
10.0000 mL | ORAL | Status: DC | PRN
  Filled 2023-12-06: qty 15

## 2023-12-06 MED ORDER — CHLORHEXIDINE GLUCONATE 0.12 % MT SOLN
OROMUCOSAL | Status: AC
  Filled 2023-12-06: qty 15

## 2023-12-06 MED ORDER — ACETAMINOPHEN 10 MG/ML IV SOLN
INTRAVENOUS | Status: DC | PRN
  Administered 2023-12-06: 1000 mg via INTRAVENOUS

## 2023-12-06 MED ORDER — ACETAMINOPHEN 160 MG/5ML PO SOLN: 650.0000 mg | ORAL | Status: DC | PRN

## 2023-12-06 MED ORDER — DROPERIDOL 2.5 MG/ML IJ SOLN: 0.6250 mg | Freq: Once | INTRAMUSCULAR | Status: DC | PRN

## 2023-12-06 MED ORDER — HYDROCHLOROTHIAZIDE 25 MG PO TABS
25.0000 mg | ORAL_TABLET | Freq: Every day | ORAL | Status: DC
  Administered 2023-12-07: 25 mg via ORAL
  Filled 2023-12-06: qty 1

## 2023-12-06 MED ORDER — EPHEDRINE 5 MG/ML INJ
INTRAVENOUS | Status: AC
  Filled 2023-12-06: qty 5

## 2023-12-06 SURGICAL SUPPLY — 40 items
ATTRACTOMAT 16X20 MAGNETIC DRP (DRAPES) ×2 IMPLANT
BLADE SURG 15 STRL LF DISP TIS (BLADE) ×2 IMPLANT
CLEANER CAUTERY TIP PAD (MISCELLANEOUS) IMPLANT
CORD BIP STRL DISP 12FT (MISCELLANEOUS) ×2 IMPLANT
DERMABOND ADVANCED .7 DNX12 (GAUZE/BANDAGES/DRESSINGS) IMPLANT
DRAIN WOUND RND W/TROCAR (DRAIN) IMPLANT
DRSG TEGADERM 2-3/8X2-3/4 SM (GAUZE/BANDAGES/DRESSINGS) IMPLANT
ELECT CAUTERY BLADE TIP 2.5 (TIP) ×2 IMPLANT
ELECT LARYNGEAL DUAL CHAN (ELECTRODE) ×2 IMPLANT
ELECT NEEDLE 20X.3 GREEN (MISCELLANEOUS) ×2 IMPLANT
ELECT REM PT RETURN 9FT ADLT (ELECTROSURGICAL) ×2 IMPLANT
ELECTRODE CAUTERY BLDE TIP 2.5 (TIP) ×2 IMPLANT
ELECTRODE NDL 20X.3 GREEN (MISCELLANEOUS) ×2 IMPLANT
ELECTRODE NEEDLE 20X.3 GREEN (MISCELLANEOUS) ×2 IMPLANT
ELECTRODE REM PT RTRN 9FT ADLT (ELECTROSURGICAL) ×2 IMPLANT
EVACUATOR SILICONE 100CC (DRAIN) IMPLANT
FORCEPS JEWEL BIP 4-3/4 STR (INSTRUMENTS) ×2 IMPLANT
GAUZE 4X4 16PLY ~~LOC~~+RFID DBL (SPONGE) ×2 IMPLANT
GLOVE BIO SURGEON STRL SZ7.5 (GLOVE) ×4 IMPLANT
GOWN STRL REUS W/ TWL LRG LVL3 (GOWN DISPOSABLE) ×6 IMPLANT
HEMOSTAT SURGICEL 2X3 (HEMOSTASIS) ×2 IMPLANT
HOOK STAY BLUNT/RETRACTOR 5M (MISCELLANEOUS) IMPLANT
KIT TURNOVER KIT A (KITS) ×2 IMPLANT
LABEL OR SOLS (LABEL) ×2 IMPLANT
MANIFOLD NEPTUNE II (INSTRUMENTS) ×2 IMPLANT
NS IRRIG 500ML POUR BTL (IV SOLUTION) ×2 IMPLANT
PACK HEAD/NECK (MISCELLANEOUS) ×2 IMPLANT
PROBE NEUROSIGN BIPOL (MISCELLANEOUS) ×2 IMPLANT
SHEARS HARMONIC 9CM CVD (BLADE) ×2 IMPLANT
SOL PREP PVP 2OZ (MISCELLANEOUS) ×2 IMPLANT
SOLUTION PREP PVP 2OZ (MISCELLANEOUS) ×2 IMPLANT
SPONGE KITTNER 5P (MISCELLANEOUS) ×2 IMPLANT
STRIP CLOSURE SKIN 1/4X4 (GAUZE/BANDAGES/DRESSINGS) IMPLANT
SUT ETHILON 3-0 (SUTURE) IMPLANT
SUT PROLENE 6 0 P 1 18 (SUTURE) ×2 IMPLANT
SUT SILK 2 0 SH (SUTURE) ×2 IMPLANT
SUT SILK 2-0 18XBRD TIE 12 (SUTURE) ×2 IMPLANT
SUT VIC AB 4-0 RB1 18 (SUTURE) ×2 IMPLANT
TRAP FLUID SMOKE EVACUATOR (MISCELLANEOUS) ×2 IMPLANT
WATER STERILE IRR 500ML POUR (IV SOLUTION) ×2 IMPLANT

## 2023-12-06 NOTE — Op Note (Signed)
 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  Findings: Bilateral recurrent laryngeal nerves identified and preserved and stimulated at end of procedure.  Bilateral superior and inferior parathyroid glands identified and preserved.  Description of Procedure:  After the patient was identified in holding and the history and physical and consent was reviewed and updated.  The patient was marked in an upright position on the anterior neck along a natural occuring skin crease.  The patient was next taken to the operating room and placed in a supine position.  General endotracheal anesthesia was induced with laryngeal monitor endotracheal tube.  Direct visualization by the surgeon of the tube electrodes in contact with the vocal cords was made..  The patient's anterior marked neck crease was neck injected with 8cc's of 0.5% marcaine with 1:200,000 Epinephrine mixed with 40mg  of Kenalog due to history of Keloid formation.  The patient was next prepped and draped in a sterile normal fashion.  At this time, a 15 blade scalpel was used to make a skin incision along a previously marked anterior neck crease.  Dissection was carefully performed through the subcutaneous tissues with combination of Bovie electrocautery and blunt dissection.  The platysma was incised and anterior neck veins ligated with harmonic scalpel.  The median raphe of the strap muscles was divided in a linear fashion with Bovie electrocautery until the anterior border of the thyroid gland was identified.     Attention at this  time was directed to the patient's right side. The sternohyoid muscle was bluntly dissected away from the large left thyroid gland.  The lateral border of the thyroid and the carotid artery was identified.  Dissection bluntly and with Bovie electrocautery was continued superiorly and inferiorly along the lateral edge of the thyroid.  The superior vessels were identified laterally and then medially with blunt dissection in Joel's space between the larynx and the superior thyroid pole.  Further dissection was continued inferiorly as well.  Once the superior pole was pedicled, the superior thyroid vessels were ligated with Harmonic Scalpel.  The large right hemi-thyroid was delivered from the wound.  The large right hyroid nodule once delivered from the wound revealed Berry's ligament and the nodule of Zuckerkandl.  Just beneath this nodule, the recurrent laryngeal nerve was identified and stimulated robustly with movement of the arytenoid joint as well as robust stimulation via nerve monitor.  The inferior parathyroid was identified adjacent to the nerve along with the superior parathyroid as well.  These were identified..  The nerve was tracked until its insertion into the larynx.  Next, the remaining attachments of Berry's ligament was divided and the right hemi-thyroid gland was completed  Attention at this time was directed to the patient's left side. The sternohyoid muscle was bluntly dissected away from the right hemithyroid.  The lateral border of the thyroid and the carotid artery was identified.  Dissection bluntly and with Bovie electrocautery was continued superiorly and inferiorly along the lateral edge of the thyroid.  The superior vessels were identified laterally and then medially with blunt dissection in Joel's space between  the larynx and the superior thyroid pole.  Further dissection was continued inferiorly as well.  Once the superior pole was pedicled, the superior thyroid vessels were ligated  with Harmonic Scalpel.  The left hemithyroid was delivered from the wound after further dissection inferior and superiorly.   Once delivered from the wound revealed Berry's ligament and the nodule of Zuckerkandl.  Just beneath this nodule, the recurrent laryngeal nerve was identified and stimulated robustly with movement of the arytenoid joint.  The inferior and superior parathyroids were identified adjacent to the nerve along with the superior parathyroid as well.  These were identified with good vascularization.  The nerve was tracked until its insertion into the larynx.  Next, the remaining attachments of Berry's ligament was divided and the right hemithyroid gland was completed.  There remaining attachments of the total thyroid were separated from the larynx using bipolar and harmonic scalpel.  The gland was marked on the superior pole of the patient's right thyroid gland with a marking stitch and passed off the table for permanent evaluation.  The wound was copiously irrigated with sterile saline.  Meticulous hemostasis with bipolar was obtained.  Visualization of an intact left and right recurrent laryngeal nerves was made and these stimulated robustly.  The parathyroid glands were intact and well perfused.  Surgicel was placed in the wound bed bilaterally.  The wound was then closed in a multilayered fashion with vicryl for subcutaneous tissues and Dermabond.  At this time the patient was extubated and taken to PACU in good condition.  Plan:  Admit for observation.  Follow pathology.  Limit activity for 2 weeks.  Follow up next week for post-operative evaluation..  Follow calcium levels.  Begin synthroid.  Calcium taper.  Stacy Vasquez  12/06/2023 9:02 AM

## 2023-12-06 NOTE — Anesthesia Preprocedure Evaluation (Addendum)
 Anesthesia Evaluation  Patient identified by MRN, date of birth, ID band Patient awake    Reviewed: Allergy & Precautions, H&P , NPO status , Patient's Chart, lab work & pertinent test results  Airway Mallampati: II  TM Distance: >3 FB Neck ROM: full    Dental  (+) Loose,    Pulmonary former smoker   Pulmonary exam normal        Cardiovascular Exercise Tolerance: Good hypertension, Normal cardiovascular exam     Neuro/Psych negative neurological ROS  negative psych ROS   GI/Hepatic negative GI ROS, Neg liver ROS,,,  Endo/Other  Neoplasm uncertain behavior - thyroid  Renal/GU      Musculoskeletal  (+) Arthritis ,    Abdominal  (+) + obese  Peds  Hematology negative hematology ROS (+)   Anesthesia Other Findings Past Medical History: No date: Anemia     Comment:  before her hystectomy 01/05/2021: Bilateral hip pain No date: Blood transfusion without reported diagnosis No date: Cataract     Comment:  ? which eye wears glasses  08/14/2019: Chronic pain of right knee 05/09/2018: Heel pain 01/05/2021: Hip arthritis     Comment:  Mild to moderate left and mild right xray 01/01/21 No date: Hyperlipidemia No date: Hypertension 05/09/2018: Pain of right midfoot No date: Pre-diabetes No date: Prediabetes 12/29/2017: Thyroid enlargement 07/05/2022: Upper respiratory infection, viral  Past Surgical History: No date: ABDOMINAL HYSTERECTOMY     Comment:  over 30 years fibroids no h/o abnormal pap   BMI    Body Mass Index: 30.38 kg/m      Reproductive/Obstetrics negative OB ROS                              Anesthesia Physical Anesthesia Plan  ASA: 2  Anesthesia Plan: General   Post-op Pain Management: Ofirmev IV (intra-op)* and Toradol IV (intra-op)*   Induction: Intravenous  PONV Risk Score and Plan: Dexamethasone, Ondansetron, Midazolam and Treatment may vary due to age or  medical condition  Airway Management Planned: Oral ETT  Additional Equipment:   Intra-op Plan:   Post-operative Plan: Extubation in OR  Informed Consent: I have reviewed the patients History and Physical, chart, labs and discussed the procedure including the risks, benefits and alternatives for the proposed anesthesia with the patient or authorized representative who has indicated his/her understanding and acceptance.     Dental Advisory Given  Plan Discussed with: Anesthesiologist, CRNA and Surgeon  Anesthesia Plan Comments:          Anesthesia Quick Evaluation

## 2023-12-06 NOTE — Anesthesia Procedure Notes (Signed)
 Procedure Name: Intubation Date/Time: 12/06/2023 7:36 AM  Performed by: Morene Crocker, CRNAPre-anesthesia Checklist: Patient identified, Patient being monitored, Timeout performed, Emergency Drugs available and Suction available Patient Re-evaluated:Patient Re-evaluated prior to induction Oxygen Delivery Method: Circle system utilized Preoxygenation: Pre-oxygenation with 100% oxygen Induction Type: IV induction Ventilation: Mask ventilation without difficulty Laryngoscope Size: 3 and Glidescope Grade View: Grade I Tube type: Oral Tube size: 7.0 mm Number of attempts: 1 Airway Equipment and Method: Stylet Placement Confirmation: ETT inserted through vocal cords under direct vision, positive ETCO2 and breath sounds checked- equal and bilateral Secured at: 21 cm Tube secured with: Tape Dental Injury: Teeth and Oropharynx as per pre-operative assessment  Comments:  "Neurosign" nerve monitor Applied to ETT. Smooth, atraumatic placement of ETT by Dixie Dials, SRNA,  with Dr. Andee Poles in room to verify placement of tube between the cords.

## 2023-12-06 NOTE — Transfer of Care (Signed)
 Immediate Anesthesia Transfer of Care Note  Patient: Stacy Vasquez  Procedure(s) Performed: TOTAL THYROIDECTOMY (Bilateral: Neck) INJECTION, TRIAMCINOLONE (Neck) NERVE MONITORING, INTRAOPERATIVE (Bilateral: Neck)  Patient Location: PACU  Anesthesia Type:General  Level of Consciousness: awake and drowsy  Airway & Oxygen Therapy: Patient Spontanous Breathing and Patient connected to face mask oxygen  Post-op Assessment: Report given to RN and Post -op Vital signs reviewed and stable  Post vital signs: Reviewed and stable  Last Vitals:  Vitals Value Taken Time  BP 121/86 12/06/23 0915  Temp 35.9 0918  Pulse 71 12/06/23 0921  Resp 12 12/06/23 0921  SpO2 100 % 12/06/23 0921  Vitals shown include unfiled device data.  Last Pain:  Vitals:   12/06/23 0621  TempSrc: Temporal  PainSc: 0-No pain         Complications: No notable events documented.

## 2023-12-06 NOTE — H&P (Signed)
 ..  History and Physical paper copy reviewed and updated date of procedure and will be scanned into system.  Patient seen and examined.

## 2023-12-06 NOTE — Plan of Care (Signed)
  Problem: Skin Integrity: Goal: Demonstration of wound healing without infection will improve Outcome: Progressing   Problem: Respiratory: Goal: Ability to maintain a clear airway will improve Outcome: Progressing

## 2023-12-06 NOTE — Anesthesia Postprocedure Evaluation (Signed)
 Anesthesia Post Note  Patient: Stacy Vasquez  Procedure(s) Performed: TOTAL THYROIDECTOMY (Bilateral: Neck) INJECTION, TRIAMCINOLONE (Neck) NERVE MONITORING, INTRAOPERATIVE (Bilateral: Neck)  Patient location during evaluation: PACU Anesthesia Type: General Level of consciousness: awake and alert Pain management: pain level controlled Vital Signs Assessment: post-procedure vital signs reviewed and stable Respiratory status: spontaneous breathing, nonlabored ventilation and respiratory function stable Cardiovascular status: blood pressure returned to baseline and stable Postop Assessment: no apparent nausea or vomiting Anesthetic complications: no   No notable events documented.   Last Vitals:  Vitals:   12/06/23 1145 12/06/23 1215  BP:    Pulse: 92 95  Resp: (!) 23 14  Temp:    SpO2: 95% 92%    Last Pain:  Vitals:   12/06/23 1229  TempSrc:   PainSc: 3                  Foye Deer

## 2023-12-07 ENCOUNTER — Encounter: Payer: Self-pay | Admitting: Otolaryngology

## 2023-12-07 DIAGNOSIS — D440 Neoplasm of uncertain behavior of thyroid gland: Secondary | ICD-10-CM | POA: Diagnosis not present

## 2023-12-07 MED ORDER — ONDANSETRON HCL 4 MG PO TABS: 4.0000 mg | ORAL_TABLET | ORAL | 0 refills | Status: DC | PRN

## 2023-12-07 MED ORDER — DOCUSATE SODIUM 100 MG PO CAPS: 100.0000 mg | ORAL_CAPSULE | Freq: Two times a day (BID) | ORAL | 0 refills | Status: DC

## 2023-12-07 MED ORDER — CALCIUM CARBONATE 1250 (500 CA) MG PO TABS: 2500.0000 mg | ORAL_TABLET | Freq: Three times a day (TID) | ORAL | 1 refills | Status: DC

## 2023-12-07 MED ORDER — HYDROCODONE-ACETAMINOPHEN 7.5-325 MG/15ML PO SOLN: 10.0000 mL | ORAL | 0 refills | Status: DC | PRN

## 2023-12-07 NOTE — Progress Notes (Signed)
 DISCHARGE NOTE:   Pt discharged with IV removed and education given. Pt voices no questions or concerns at this time. Pt wheeled down to medical mall entrance by staff and pt's sister provided transportation.

## 2023-12-07 NOTE — Plan of Care (Signed)
  Problem: Education: Goal: Knowledge of the prescribed therapeutic regimen will improve Outcome: Progressing   Problem: Activity: Goal: Ability to tolerate increased activity will improve Outcome: Progressing   Problem: Clinical Measurements: Goal: Complications related to the disease process, condition or treatment will be avoided or minimized Outcome: Progressing   Problem: Respiratory: Goal: Will regain and/or maintain adequate ventilation Outcome: Progressing   Problem: Skin Integrity: Goal: Demonstration of wound healing without infection will improve Outcome: Progressing

## 2023-12-07 NOTE — Progress Notes (Signed)
 Patient reports JP drain dislodged during sleep.  RN assessed area on neck,no drainage, applied gauze and clear adhesive, informed MD.

## 2023-12-07 NOTE — Final Progress Note (Signed)
..  12/07/2023 6:45 AM  Bobby Rumpf [id number]  Post-Op Day 1    Temp:  [97 F (36.1 C)-98.7 F (37.1 C)] 98.1 F (36.7 C) (03/26 0416) Pulse Rate:  [70-95] 80 (03/26 0416) Resp:  [12-23] 14 (03/26 0416) BP: (120-165)/(67-144) 132/79 (03/26 0416) SpO2:  [92 %-100 %] 94 % (03/26 0416),     Intake/Output Summary (Last 24 hours) at 12/07/2023 0645 Last data filed at 12/06/2023 2100 Gross per 24 hour  Intake 1590 ml  Output 10 ml  Net 1580 ml    Results for orders placed or performed during the hospital encounter of 12/06/23 (from the past 24 hours)  Magnesium     Status: None   Collection Time: 12/06/23 10:12 AM  Result Value Ref Range   Magnesium 1.9 1.7 - 2.4 mg/dL  Albumin     Status: None   Collection Time: 12/06/23 10:12 AM  Result Value Ref Range   Albumin 3.9 3.5 - 5.0 g/dL  Calcium     Status: None   Collection Time: 12/06/23 10:12 AM  Result Value Ref Range   Calcium 8.9 8.9 - 10.3 mg/dL  Calcium     Status: None   Collection Time: 12/06/23  6:01 PM  Result Value Ref Range   Calcium 9.5 8.9 - 10.3 mg/dL    SUBJECTIVE:  No acute events.  Pain controlled.  JP drain fell out this morning.  Tolerating PO.  Ambulating.  OBJECTIVE:  GEN-  NAD, supine in bed with head elevated NECK-  JP dressing removed.  Looks good.  Steri strip in place and secure.  No significant edema or erythema  IMPRESSION:  s/p Total thyroidectomy and injection of Kenalog for Thyroseq positive nodule doing well  PLAN:  Will discharge home on calcium taper, pain medication, colace, and Zofran.  Hold on Synthroid until pathology has been done.  Follow up in 1 week.  Croy Drumwright 12/07/2023, 6:45 AM

## 2023-12-12 LAB — SURGICAL PATHOLOGY

## 2024-01-07 ENCOUNTER — Other Ambulatory Visit: Payer: Self-pay | Admitting: Family Medicine

## 2024-01-07 DIAGNOSIS — E785 Hyperlipidemia, unspecified: Secondary | ICD-10-CM

## 2024-01-23 DIAGNOSIS — Z483 Aftercare following surgery for neoplasm: Secondary | ICD-10-CM | POA: Diagnosis not present

## 2024-01-24 ENCOUNTER — Ambulatory Visit (INDEPENDENT_AMBULATORY_CARE_PROVIDER_SITE_OTHER): Payer: Medicare HMO | Admitting: Family Medicine

## 2024-01-24 ENCOUNTER — Encounter: Payer: Self-pay | Admitting: Family Medicine

## 2024-01-24 VITALS — BP 118/72 | HR 77 | Temp 98.3°F | Resp 20 | Ht 67.0 in | Wt 190.2 lb

## 2024-01-24 DIAGNOSIS — Z9889 Other specified postprocedural states: Secondary | ICD-10-CM

## 2024-01-24 DIAGNOSIS — I10 Essential (primary) hypertension: Secondary | ICD-10-CM

## 2024-01-24 DIAGNOSIS — Z9089 Acquired absence of other organs: Secondary | ICD-10-CM

## 2024-01-24 DIAGNOSIS — M47816 Spondylosis without myelopathy or radiculopathy, lumbar region: Secondary | ICD-10-CM | POA: Diagnosis not present

## 2024-01-24 DIAGNOSIS — E785 Hyperlipidemia, unspecified: Secondary | ICD-10-CM | POA: Diagnosis not present

## 2024-01-24 MED ORDER — PRAVASTATIN SODIUM 20 MG PO TABS: 20.0000 mg | ORAL_TABLET | Freq: Every day | ORAL | 3 refills | Status: DC

## 2024-01-24 MED ORDER — TELMISARTAN-HCTZ 80-25 MG PO TABS: 1.0000 | ORAL_TABLET | Freq: Every day | ORAL | 3 refills | Status: DC

## 2024-01-24 NOTE — Progress Notes (Signed)
 SUBJECTIVE:   Chief Complaint  Patient presents with   Medical Management of Chronic Issues    6 month follow     HPI Presents to clinic for follow up chronic disease management  Discussed the use of AI scribe software for clinical note transcription with the patient, who gave verbal consent to proceed.  History of Present Illness Stacy Vasquez is a 78 year old female with hypertension who presents for follow-up of her blood pressure management.  She notes that her blood pressure decreases after taking a shower, with a recent reading of 118/72 mmHg. At home, her blood pressure is typically around 130 mmHg. She was recently hospitalized, during which her blood pressure was also monitored.  She underwent thyroid  surgery to remove a goiter that had been increasing in size, causing difficulty swallowing. A new small nodule was found, and although it was not confirmed to be cancerous, she opted for surgery. Her thyroid  levels and medication are monitored by her ENT. She had her thyroid  levels checked yesterday and is currently taking Levothyroxine 100 mcg daily  She experiences arthritis related pain for about a week, which is alleviated by staying active. She is concerned about possible interactions between Tylenol  Arthritis and her blood pressure medication.  She takes her thyroid  medication first thing in the morning on an empty stomach and waits an hour before taking her blood pressure medication. She is currently on blood pressure medication and Pravachol .     PERTINENT PMH / PSH: As above  OBJECTIVE:  BP 118/72   Pulse 77   Temp 98.3 F (36.8 C)   Resp 20   Ht 5\' 7"  (1.702 m)   Wt 190 lb 4 oz (86.3 kg)   SpO2 98%   BMI 29.80 kg/m    Physical Exam Vitals reviewed.  Constitutional:      General: She is not in acute distress.    Appearance: Normal appearance. She is not ill-appearing, toxic-appearing or diaphoretic.  Eyes:     General:        Right eye: No  discharge.        Left eye: No discharge.     Conjunctiva/sclera: Conjunctivae normal.  Cardiovascular:     Rate and Rhythm: Normal rate and regular rhythm.     Heart sounds: Normal heart sounds.  Pulmonary:     Effort: Pulmonary effort is normal.     Breath sounds: Normal breath sounds.  Abdominal:     General: Bowel sounds are normal.  Musculoskeletal:        General: Normal range of motion.  Skin:    General: Skin is warm and dry.  Neurological:     General: No focal deficit present.     Mental Status: She is alert and oriented to person, place, and time. Mental status is at baseline.  Psychiatric:        Mood and Affect: Mood normal.        Behavior: Behavior normal.        Thought Content: Thought content normal.        Judgment: Judgment normal.           01/24/2024    9:01 AM 11/17/2023   12:00 PM 07/27/2023    1:44 PM 01/17/2023   11:28 AM 08/24/2022    2:19 PM  Depression screen PHQ 2/9  Decreased Interest 0 0 0 0 0  Down, Depressed, Hopeless 0 0 0 0 0  PHQ - 2 Score 0 0 0  0 0  Altered sleeping 0 0 0 0   Tired, decreased energy 0 0 0 0   Change in appetite 0 0 0 0   Feeling bad or failure about yourself  0 0 0 0   Trouble concentrating 0 0 0 0   Moving slowly or fidgety/restless 0 0 0 0   Suicidal thoughts 0 0 0 0   PHQ-9 Score 0 0 0 0   Difficult doing work/chores Not difficult at all Not difficult at all Not difficult at all Not difficult at all       01/24/2024    9:01 AM 11/17/2023   12:00 PM 07/27/2023    1:45 PM 01/17/2023   11:29 AM  GAD 7 : Generalized Anxiety Score  Nervous, Anxious, on Edge 0 0 0 0  Control/stop worrying 0 0 0 0  Worry too much - different things 0 0 0 0  Trouble relaxing 0 0 0 0  Restless 0 0 0 0  Easily annoyed or irritable 0 0 0 0  Afraid - awful might happen 0 0 0 0  Total GAD 7 Score 0 0 0 0  Anxiety Difficulty Not difficult at all Not difficult at all Not difficult at all Not difficult at all    ASSESSMENT/PLAN:   Essential hypertension Assessment & Plan: Hypertension well-controlled with current medication. Blood pressure 118/72 mmHg, within target range.  - Refill Micardis   Orders: -     Telmisartan -HCTZ; Take 1 tablet by mouth daily. In am  Dispense: 90 tablet; Refill: 3  Hyperlipidemia, unspecified hyperlipidemia type Assessment & Plan: Refill Pravachol     Orders: -     Pravastatin  Sodium; Take 1 tablet (20 mg total) by mouth at bedtime.  Dispense: 90 tablet; Refill: 3  Arthritis of lumbar spine Assessment & Plan: Tylenol  Arthritis recommended for pain management without interfering with antihypertensive medication. - Initiate Tylenol  Arthritis as needed, two tablets before bed. - Avoid NSAIDs like ibuprofen due to interaction with antihypertensive medication. - Encourage regular physical activity.   S/P total thyroidectomy Assessment & Plan: Post-thyroidectomy with no symptoms. Thyroid  function monitored by surgeon. On thyroid  replacement therapy, compliant with medication regimen. - Continue thyroid  replacement therapy as prescribed. Managed by ENT      PDMP reviewed  Return if symptoms worsen or fail to improve, for PCP.  Valli Gaw, MD

## 2024-01-24 NOTE — Patient Instructions (Signed)
 It was a pleasure meeting you today. Thank you for allowing me to take part in your health care.  Our goals for today as we discussed include:  Refills sent for requested medications  Follow up with Dr Dickson Founds as scheduled   This is a list of the screening recommended for you and due dates:  Health Maintenance  Topic Date Due   Zoster (Shingles) Vaccine (1 of 2) Never done   Medicare Annual Wellness Visit  07/02/2021   COVID-19 Vaccine (5 - 2024-25 season) 05/15/2023   Pneumonia Vaccine (1 of 1 - PCV) 08/04/2024*   Flu Shot  04/13/2024   DEXA scan (bone density measurement)  Completed   Hepatitis C Screening  Completed   HPV Vaccine  Aged Out   Meningitis B Vaccine  Aged Out   DTaP/Tdap/Td vaccine  Discontinued  *Topic was postponed. The date shown is not the original due date.      If you have any questions or concerns, please do not hesitate to call the office at [phone].  I look forward to our next visit and until then take care and stay safe.  Regards,   Valli Gaw, MD   [facility]

## 2024-01-29 ENCOUNTER — Encounter: Payer: Self-pay | Admitting: Family Medicine

## 2024-01-29 NOTE — Assessment & Plan Note (Signed)
Refill Pravachol

## 2024-01-29 NOTE — Assessment & Plan Note (Signed)
 Post-thyroidectomy with no symptoms. Thyroid  function monitored by surgeon. On thyroid  replacement therapy, compliant with medication regimen. - Continue thyroid  replacement therapy as prescribed. Managed by ENT

## 2024-01-29 NOTE — Assessment & Plan Note (Signed)
 Hypertension well-controlled with current medication. Blood pressure 118/72 mmHg, within target range.  - Refill Micardis 

## 2024-01-29 NOTE — Assessment & Plan Note (Signed)
 Tylenol  Arthritis recommended for pain management without interfering with antihypertensive medication. - Initiate Tylenol  Arthritis as needed, two tablets before bed. - Avoid NSAIDs like ibuprofen due to interaction with antihypertensive medication. - Encourage regular physical activity.

## 2024-03-27 ENCOUNTER — Telehealth: Payer: Self-pay

## 2024-03-27 DIAGNOSIS — Z1231 Encounter for screening mammogram for malignant neoplasm of breast: Secondary | ICD-10-CM

## 2024-03-27 NOTE — Telephone Encounter (Signed)
 Copied from CRM [id number]. Topic: Clinical - Medical Advice >> Mar 27, 2024  1:42 PM [name] wrote: Reason for CRM: Patient was a previous patient of Dr. Hope and has her [facility] with Dr. Abbey in January, but patient received a letter stating that she is due for her mammogram and is needing a PCP to order this, patient questioning if this can get ordered. Callback number for patient is [phone].

## 2024-03-28 NOTE — Addendum Note (Signed)
 Addended by: MARYLYNN VERNEITA CROME on: 03/28/2024 12:22 PM   Modules accepted: Orders

## 2024-03-28 NOTE — Telephone Encounter (Signed)
 Called and let pt know that mammo has been ordered, and she just needs to give them a call to get scheduled.

## 2024-04-13 DIAGNOSIS — E041 Nontoxic single thyroid nodule: Secondary | ICD-10-CM | POA: Diagnosis not present

## 2024-04-23 ENCOUNTER — Ambulatory Visit

## 2024-04-23 DIAGNOSIS — E890 Postprocedural hypothyroidism: Secondary | ICD-10-CM | POA: Diagnosis not present

## 2024-04-30 ENCOUNTER — Ambulatory Visit
Admission: RE | Admit: 2024-04-30 | Discharge: 2024-04-30 | Disposition: A | Discharge status: Home / Self Care | Source: Ambulatory Visit | Attending: Internal Medicine | Admitting: Internal Medicine

## 2024-04-30 DIAGNOSIS — Z1231 Encounter for screening mammogram for malignant neoplasm of breast: Secondary | ICD-10-CM | POA: Diagnosis not present

## 2024-07-12 DIAGNOSIS — E041 Nontoxic single thyroid nodule: Secondary | ICD-10-CM | POA: Diagnosis not present

## 2024-07-30 ENCOUNTER — Ambulatory Visit: Admitting: Internal Medicine

## 2024-08-06 ENCOUNTER — Encounter: Payer: Self-pay | Admitting: Internal Medicine

## 2024-08-06 ENCOUNTER — Ambulatory Visit (INDEPENDENT_AMBULATORY_CARE_PROVIDER_SITE_OTHER): Admitting: Internal Medicine

## 2024-08-06 VITALS — BP 114/68 | HR 64 | Temp 97.7°F | Ht 67.0 in | Wt 188.0 lb

## 2024-08-06 DIAGNOSIS — R7303 Prediabetes: Secondary | ICD-10-CM

## 2024-08-06 DIAGNOSIS — Z9089 Acquired absence of other organs: Secondary | ICD-10-CM | POA: Diagnosis not present

## 2024-08-06 DIAGNOSIS — E785 Hyperlipidemia, unspecified: Secondary | ICD-10-CM

## 2024-08-06 DIAGNOSIS — K5900 Constipation, unspecified: Secondary | ICD-10-CM | POA: Diagnosis not present

## 2024-08-06 DIAGNOSIS — Z9889 Other specified postprocedural states: Secondary | ICD-10-CM | POA: Diagnosis not present

## 2024-08-06 DIAGNOSIS — I10 Essential (primary) hypertension: Secondary | ICD-10-CM

## 2024-08-06 NOTE — Assessment & Plan Note (Signed)
-   Patient does complain of constipation for the last month -States that she does require Senokot if she gets constipated -She normally has a bowel movement every day but over the last month she has had intermittent constipation -She does state that she has low dietary fiber intake and this may be contributing to her constipation -Abdominal exam was benign -She will initiate Metamucil and increase her fruits and vegetable intake to see if this will help with her constipation -She will continue Senokot as needed for her constipation -No further workup at this time

## 2024-08-06 NOTE — Assessment & Plan Note (Signed)
-   Patient had a total thyroidectomy earlier this year -She currently complains of constipation but denies any other symptoms of hypothyroidism -Heart rate is borderline bradycardic in the 60s -Patient states that she is following up with her surgeon who manages her thyroid  replacement therapy and that her last TSH was not normal -She is currently on Synthroid 75 mcg daily and will follow-up with her surgeon for further evaluation -No further workup at this time

## 2024-08-06 NOTE — Patient Instructions (Signed)
  VISIT SUMMARY: Today, we discussed your recent issues with constipation, your thyroid  hormone management following your thyroidectomy, and a recent flare-up of your hip arthritis. We also reviewed your general health maintenance, including blood pressure, cholesterol, kidney function, and calcium  levels.  YOUR PLAN: -CONSTIPATION: Constipation is when you have infrequent or difficult bowel movements. Your constipation may be due to low fiber intake, changes in your thyroid  medication, and calcium  supplements. To help, increase your dietary fiber, consider using Metamucil, and continue using Senokot as needed.   -POST-THYROIDECTOMY STATUS WITH HYPOTHYROIDISM: Hypothyroidism means your thyroid  gland is not making enough thyroid  hormone. After your thyroidectomy, you are on 75 micrograms of thyroid  medication. Your recent constipation might be related to your thyroid  function. Please follow up with to evaluate your thyroid  levels and possibly adjust your medication.  -OSTEOARTHRITIS OF HIPS: Osteoarthritis is a condition that causes joint pain and stiffness. You had a recent flare-up of hip arthritis, but it has now resolved. You are not currently taking any medication for arthritis.  -GENERAL HEALTH MAINTENANCE: Your blood pressure is well-controlled. We rechecked your cholesterol panel, kidney function, and calcium  levels, as your last cholesterol test was over a year ago.  INSTRUCTIONS: Please follow up with your endocrinologist to evaluate your thyroid  function and discuss any necessary adjustments to your medication.                      Contains text generated by [employer]bridge.                                 Contains text generated by Abridge.

## 2024-08-06 NOTE — Assessment & Plan Note (Signed)
-   Patient was noted to have an elevated A1c at 6.3 last year consistent with prediabetes -We will recheck her A1c today and consider initiation of diabetes medication if she is in the diabetic range -No further workup at this time

## 2024-08-06 NOTE — Assessment & Plan Note (Signed)
-   This problem is chronic and stable -Will continue with pravastatin  20 mg daily -Will recheck lipid panel today -No further workup at this time

## 2024-08-06 NOTE — Assessment & Plan Note (Signed)
-   Blood pressure is well-controlled with current medication regimen -Blood pressure today is 114/68 -Will continue telmisartan /HCTZ 80/25 mg daily -Will check CMP today -No further workup at this time

## 2024-08-06 NOTE — Progress Notes (Signed)
 Acute Office Visit  Subjective:     Patient ID: Stacy Vasquez, female    DOB: [birth year], 78 y.o.   MRN: [id number]  Chief Complaint  Patient presents with   Follow-up    6 month follow up    Discussed the use of AI scribe software for clinical note transcription with the patient, who gave verbal consent to proceed.  History of Present Illness Stacy Vasquez is a 78 year old female who presents with constipation.  Constipation - Intermittent constipation for approximately one month - Usual bowel movement frequency is once daily - It was worsened by calcium  supplements and she is no longer on calcium  supplementation - Relief achieved with Senokot when she is constipated, resulting in a bowel movement the following morning - Low dietary fiber intake - Previously used Metamucil for similar symptoms - Avoids iron due to digestive issues - Certain foods, such as chili dogs, cause gastrointestinal discomfort  Post-thyroidectomy status and thyroid  hormone management - Underwent thyroidectomy in March for a large, asymptomatic goiter - Received calcium  supplementation up to 1 week postop and then stopped - Current levothyroxine dosage is 75 micrograms, previously reduced from 100 and 88 micrograms - Thyroid  hormone levels were not within normal limits in October, per her recollection but unsure what it was.  Arthralgia - Recent flare of hip arthritis, present for five to seven years - Pain was severe enough to impair ability to sit, but has since improved - Family history of arthritis in mother and sisters    Review of Systems  Constitutional: Negative.   HENT: Negative.    Respiratory: Negative.    Cardiovascular: Negative.   Gastrointestinal:  Positive for constipation. Negative for abdominal pain, nausea and vomiting.  Genitourinary: Negative.   Musculoskeletal: Negative.   Neurological: Negative.   Psychiatric/Behavioral: Negative.          Objective:    BP  114/68   Pulse 64   Temp 97.7 F (36.5 C)   Ht 5' 7 (1.702 m)   Wt 188 lb (85.3 kg)   SpO2 98%   BMI 29.44 kg/m    Physical Exam Constitutional:      Appearance: Normal appearance.  HENT:     Head: Normocephalic and atraumatic.  Cardiovascular:     Rate and Rhythm: Normal rate and regular rhythm.     Heart sounds: Normal heart sounds.  Pulmonary:     Effort: Pulmonary effort is normal.     Breath sounds: Normal breath sounds. No wheezing, rhonchi or rales.  Abdominal:     General: Bowel sounds are normal. There is no distension.     Palpations: Abdomen is soft.     Tenderness: There is no abdominal tenderness. There is no guarding or rebound.  Musculoskeletal:        General: No swelling or tenderness.     Right lower leg: No edema.     Left lower leg: No edema.  Neurological:     Mental Status: She is alert.  Psychiatric:        Mood and Affect: Mood normal.        Behavior: Behavior normal.     No results found for any visits on 08/06/24.      Assessment & Plan:   Problem List Items Addressed This Visit       Cardiovascular and Mediastinum   Essential hypertension - Primary   - Blood pressure is well-controlled with current medication regimen -Blood pressure today is 114/68 -Will  continue telmisartan /HCTZ 80/25 mg daily -Will check CMP today -No further workup at this time      Relevant Orders   Comprehensive metabolic panel with GFR     Other   Constipation   - Patient does complain of constipation for the last month -States that she does require Senokot if she gets constipated -She normally has a bowel movement every day but over the last month she has had intermittent constipation -She does state that she has low dietary fiber intake and this may be contributing to her constipation -Abdominal exam was benign -She will initiate Metamucil and increase her fruits and vegetable intake to see if this will help with her constipation -She will  continue Senokot as needed for her constipation -No further workup at this time      Hyperlipidemia   - This problem is chronic and stable -Will continue with pravastatin  20 mg daily -Will recheck lipid panel today -No further workup at this time      Relevant Orders   Lipid panel   Prediabetes   - Patient was noted to have an elevated A1c at 6.3 last year consistent with prediabetes -We will recheck her A1c today and consider initiation of diabetes medication if she is in the diabetic range -No further workup at this time      Relevant Orders   Hemoglobin A1c   S/P total thyroidectomy   - Patient had a total thyroidectomy earlier this year -She currently complains of constipation but denies any other symptoms of hypothyroidism -Heart rate is borderline bradycardic in the 60s -Patient states that she is following up with her surgeon who manages her thyroid  replacement therapy and that her last TSH was not normal -She is currently on Synthroid 75 mcg daily and will follow-up with her surgeon for further evaluation -No further workup at this time       No orders of the defined types were placed in this encounter.   No follow-ups on file.  Keerthi Hazell, MD

## 2024-08-07 ENCOUNTER — Ambulatory Visit: Payer: Self-pay | Admitting: Internal Medicine

## 2024-08-07 DIAGNOSIS — N183 Chronic kidney disease, stage 3 unspecified: Secondary | ICD-10-CM | POA: Insufficient documentation

## 2024-08-07 DIAGNOSIS — N1831 Chronic kidney disease, stage 3a: Secondary | ICD-10-CM

## 2024-08-07 LAB — COMPREHENSIVE METABOLIC PANEL WITH GFR
ALT: 11 U/L (ref 0–35)
AST: 13 U/L (ref 0–37)
Albumin: 4.3 g/dL (ref 3.5–5.2)
Alkaline Phosphatase: 61 U/L (ref 39–117)
BUN: 14 mg/dL (ref 6–23)
CO2: 29 meq/L (ref 19–32)
Calcium: 9.2 mg/dL (ref 8.4–10.5)
Chloride: 105 meq/L (ref 96–112)
Creatinine, Ser: 0.99 mg/dL (ref 0.40–1.20)
GFR: 55 mL/min — ABNORMAL LOW (ref >60.00)
Glucose, Bld: 82 mg/dL (ref 70–99)
Potassium: 4 meq/L (ref 3.5–5.1)
Sodium: 142 meq/L (ref 135–145)
Total Bilirubin: 1.3 mg/dL — ABNORMAL HIGH (ref 0.2–1.2)
Total Protein: 6.3 g/dL (ref 6.0–8.3)

## 2024-08-07 LAB — LIPID PANEL
Cholesterol: 150 mg/dL (ref 0–200)
HDL: 55.5 mg/dL (ref >39.00)
LDL Cholesterol: 77 mg/dL (ref 0–99)
NonHDL: 94.84
Total CHOL/HDL Ratio: 3
Triglycerides: 87 mg/dL (ref 0.0–149.0)
VLDL: 17.4 mg/dL (ref 0.0–40.0)

## 2024-08-07 LAB — HEMOGLOBIN A1C: Hgb A1c MFr Bld: 5.9 % (ref 4.6–6.5)

## 2024-08-07 NOTE — Assessment & Plan Note (Signed)
-   Patient with GFR in the 50s over the last 2 year consistent with CKD 3a - I suspect this is likely secondary to her HTN and age - Her kidney function has remained stable and her BP is well controlled - Will continue to monitor yearly - No further work up for now

## 2024-08-20 ENCOUNTER — Ambulatory Visit: Admitting: Internal Medicine

## 2024-10-10 ENCOUNTER — Ambulatory Visit

## 2024-10-10 VITALS — BP 104/80 | HR 75 | Temp 98.9°F | Ht 66.0 in | Wt 191.2 lb

## 2024-10-10 DIAGNOSIS — I10 Essential (primary) hypertension: Secondary | ICD-10-CM

## 2024-10-10 DIAGNOSIS — E782 Mixed hyperlipidemia: Secondary | ICD-10-CM | POA: Diagnosis not present

## 2024-10-10 DIAGNOSIS — N1831 Chronic kidney disease, stage 3a: Secondary | ICD-10-CM | POA: Diagnosis not present

## 2024-10-10 DIAGNOSIS — Z780 Asymptomatic menopausal state: Secondary | ICD-10-CM | POA: Insufficient documentation

## 2024-10-10 DIAGNOSIS — Z8639 Personal history of other endocrine, nutritional and metabolic disease: Secondary | ICD-10-CM

## 2024-10-10 DIAGNOSIS — R7303 Prediabetes: Secondary | ICD-10-CM

## 2024-10-10 DIAGNOSIS — D72818 Other decreased white blood cell count: Secondary | ICD-10-CM | POA: Diagnosis not present

## 2024-10-10 DIAGNOSIS — N281 Cyst of kidney, acquired: Secondary | ICD-10-CM | POA: Insufficient documentation

## 2024-10-10 DIAGNOSIS — K5904 Chronic idiopathic constipation: Secondary | ICD-10-CM

## 2024-10-10 DIAGNOSIS — E041 Nontoxic single thyroid nodule: Secondary | ICD-10-CM

## 2024-10-10 DIAGNOSIS — E785 Hyperlipidemia, unspecified: Secondary | ICD-10-CM

## 2024-10-10 MED ORDER — TELMISARTAN-HCTZ 80-25 MG PO TABS: 1.0000 | ORAL_TABLET | Freq: Every day | ORAL | 3 refills | Status: AC

## 2024-10-10 MED ORDER — PRAVASTATIN SODIUM 20 MG PO TABS: 20.0000 mg | ORAL_TABLET | Freq: Every day | ORAL | 3 refills | Status: AC

## 2024-10-10 NOTE — Assessment & Plan Note (Addendum)
 S/P total thyroidectomy in 11/2023.  Continue taking Levothyroxine 100 mcg in an empty stomach and f/u with ENT as scheduled.

## 2024-10-10 NOTE — Assessment & Plan Note (Addendum)
 Chronic. Encouraged adequate hydration. Will monitor renal function intermittently. Likely secondary to hypertension.

## 2024-10-10 NOTE — Assessment & Plan Note (Addendum)
 Blood pressure well-controlled with current regimen. Systolic around 104, diastolic around 80. Continue current antihypertensive medication regimen, refill sent.  Orders:   telmisartan -hydrochlorothiazide  (MICARDIS  HCT) 80-25 MG tablet; Take 1 tablet by mouth daily. In am

## 2024-10-10 NOTE — Assessment & Plan Note (Deleted)
 SABRA

## 2024-10-10 NOTE — Patient Instructions (Addendum)
 Recommend increasing fiber intake to about 20 grams per day. Recommend drinking about 50 oz of water daily to help with constipation.    You qualify for shingles, pneumonia, covid-19 immunization. You can update these through your local pharmacy if you prefer.   YOUR BONE DENISTY SCAN (dexa)  IS DUE, PLEASE CALL AND GET THIS SCHEDULED! [facility] - call [phone]    Follow up with Dr. Abbey in 6 months, labs couple of days before appointment.

## 2024-10-10 NOTE — Assessment & Plan Note (Addendum)
 Reviewed DEXA from DEXA 03/25/20 Oral calcium  caused constipation. Recommend calcium  supplementation through diet.  Recommended vitamin D  1000 IU daily. Repeat DEXA Ordered. Patient provided with phone number to call to schedule an appointment.   Encouraged regular exercise and healthy diet. Orders:   DG Bone Density; Future

## 2024-10-10 NOTE — Assessment & Plan Note (Addendum)
 Chronic, mildly low WBC for 5 plus years. Will check CBC intermittently. No concerning symptoms. Consider hematology evaluation if change in symptoms or CBC.  Orders:   CBC w/Diff; Future

## 2024-10-10 NOTE — Assessment & Plan Note (Addendum)
 Cholesterol managed with pravastatin  20 mg at bedtime, continue.  Reviewed lipid panel from 08/06/2024 which showed LDL of 77.  Medication refill sent. Medication adherence confirmed.  Check CMP in 6 months. Orders:   pravastatin  (PRAVACHOL ) 20 MG tablet; Take 1 tablet (20 mg total) by mouth at bedtime.   Comp Met (CMET); Future

## 2024-10-10 NOTE — Progress Notes (Signed)
 "  Established Patient Office Visit TOC from Dr. Hope    Subjective  Patient ID: Stacy Vasquez, female    DOB: 12-Jul-1946  Age: 79 y.o. MRN: [id number]  Chief Complaint  Patient presents with   Establish Care    Discussed the use of AI scribe software for clinical note transcription with the patient, who gave verbal consent to proceed.  History of Present Illness Stacy Vasquez is a 79 year old female who presents to establish care with new PCP and follow-up of her chronic conditions.  H/o thyroid  nodule, status post total thyroidectomy: She underwent a total thyroidectomy in March 2025 for a thyroid  nodule and is currently taking levothyroxine, likely 100 mcg daily, first thing in the morning. Her thyroid  hormone levels are monitored by her ENT Dr. Milissa, with her next appointment scheduled for February 9.   Chronic constipation: She experiences constipation, which has improved with dose adjustments of her thyroid  medication. She uses Senokot as needed for relief.  She has a history of chronic constipation at baseline.  Previously used to take Metamucil.  Reports he does not like drinking water.  Hyperlipidemia: She takes pravastatin  at night for cholesterol management.   Hypertension: Hypertension is managed with telmisartan -hydrochlorothiazide  80-25 mg daily in the Her blood pressure has been stable.  She denies chest pain, palpitations, lower leg edema.  CKD IIIa Incidental large simple renal cyst left: noted on RUQ ultrasound on 01/29/2021 that was done for different reason. Reports hydration can be improved. No changes in urination, blood in urine, or back pain or fever.   Chronic mild leukopenia:  Mildly low white blood cell count for over five years, with no significant symptoms like fever or fatigue.  Prediabetes:  She is prediabetic with fluctuating A1c levels.  A1c 08/06/24 5.9%     ROS As per HPI    Objective:     BP 104/80 (BP Location: Right Arm,  Patient Position: Sitting)   Pulse 75   Temp 98.9 F (37.2 C) (Oral)   Ht 5' 6 (1.676 m)   Wt 191 lb 3.2 oz (86.7 kg)   SpO2 96%   BMI 30.86 kg/m      08/06/2024    2:49 PM 01/24/2024    9:01 AM 11/17/2023   12:00 PM  Depression screen PHQ 2/9  Decreased Interest 0 0 0  Down, Depressed, Hopeless 0 0 0  PHQ - 2 Score 0 0 0  Altered sleeping 0 0 0  Tired, decreased energy 0 0 0  Change in appetite 0 0 0  Feeling bad or failure about yourself  0 0 0  Trouble concentrating 0 0 0  Moving slowly or fidgety/restless 0 0 0  Suicidal thoughts 0 0 0  PHQ-9 Score 0 0  0   Difficult doing work/chores Not difficult at all Not difficult at all Not difficult at all     Data saved with a previous flowsheet row definition      08/06/2024    2:50 PM 01/24/2024    9:01 AM 11/17/2023   12:00 PM 07/27/2023    1:45 PM  GAD 7 : Generalized Anxiety Score  Nervous, Anxious, on Edge 0  0  0  0   Control/stop worrying 0  0  0  0   Worry too much - different things 0  0  0  0   Trouble relaxing 0  0  0  0   Restless 0  0  0  0  Easily annoyed or irritable 0  0  0  0   Afraid - awful might happen 0  0  0  0   Total GAD 7 Score 0 0 0 0  Anxiety Difficulty Not difficult at all Not difficult at all Not difficult at all Not difficult at all     Data saved with a previous flowsheet row definition      08/06/2024    2:49 PM 01/24/2024    9:01 AM 11/17/2023   12:00 PM  Depression screen PHQ 2/9  Decreased Interest 0 0 0  Down, Depressed, Hopeless 0 0 0  PHQ - 2 Score 0 0 0  Altered sleeping 0 0 0  Tired, decreased energy 0 0 0  Change in appetite 0 0 0  Feeling bad or failure about yourself  0 0 0  Trouble concentrating 0 0 0  Moving slowly or fidgety/restless 0 0 0  Suicidal thoughts 0 0 0  PHQ-9 Score 0 0  0   Difficult doing work/chores Not difficult at all Not difficult at all Not difficult at all     Data saved with a previous flowsheet row definition      08/06/2024    2:50 PM  01/24/2024    9:01 AM 11/17/2023   12:00 PM 07/27/2023    1:45 PM  GAD 7 : Generalized Anxiety Score  Nervous, Anxious, on Edge 0  0  0  0   Control/stop worrying 0  0  0  0   Worry too much - different things 0  0  0  0   Trouble relaxing 0  0  0  0   Restless 0  0  0  0   Easily annoyed or irritable 0  0  0  0   Afraid - awful might happen 0  0  0  0   Total GAD 7 Score 0 0 0 0  Anxiety Difficulty Not difficult at all Not difficult at all Not difficult at all Not difficult at all     Data saved with a previous flowsheet row definition   SDOH Screenings   Food Insecurity: No Food Insecurity (12/06/2023)  Housing: Low Risk (12/06/2023)  Transportation Needs: No Transportation Needs (12/06/2023)  Utilities: Not At Risk (12/06/2023)  Depression (PHQ2-9): Low Risk (08/06/2024)  Social Connections: Unknown (12/06/2023)  Tobacco Use: Medium Risk (10/10/2024)     Physical Exam Constitutional:      Appearance: Normal appearance.  HENT:     Head: Normocephalic and atraumatic.     Mouth/Throat:     Mouth: Mucous membranes are moist.  Neck:     Thyroid : No thyroid  mass or thyroid  tenderness.  Cardiovascular:     Rate and Rhythm: Normal rate and regular rhythm.     Heart sounds: No murmur heard. Pulmonary:     Effort: Pulmonary effort is normal.     Breath sounds: Normal breath sounds.  Abdominal:     General: Bowel sounds are normal.     Palpations: Abdomen is soft.     Tenderness: There is no abdominal tenderness. There is no guarding.  Musculoskeletal:     Cervical back: Neck supple. No rigidity or tenderness.     Right lower leg: No edema.     Left lower leg: No edema.  Lymphadenopathy:     Cervical: No cervical adenopathy.  Skin:    General: Skin is warm.  Neurological:     Mental Status: She is alert  and oriented to person, place, and time.  Psychiatric:        Mood and Affect: Mood normal.        Behavior: Behavior normal.        No results found for any visits  on 10/10/24.  The 10-year ASCVD risk score (Arnett DK, et al., 2019) is: 10.1%     Assessment & Plan:  Patient is a pleasant 79 year old female presenting for transfer of care from previous PCP and chronic medication management. Recommended shingles and pneumonia vaccines, patient politely declined.  Assessment & Plan Essential hypertension Blood pressure well-controlled with current regimen. Systolic around 104, diastolic around 80. Continue current antihypertensive medication regimen, refill sent.  Orders:   telmisartan -hydrochlorothiazide  (MICARDIS  HCT) 80-25 MG tablet; Take 1 tablet by mouth daily. In am  Mixed hyperlipidemia Cholesterol managed with pravastatin  20 mg at bedtime, continue.  Reviewed lipid panel from 08/06/2024 which showed LDL of 77.  Medication refill sent. Medication adherence confirmed.  Check CMP in 6 months. Orders:   pravastatin  (PRAVACHOL ) 20 MG tablet; Take 1 tablet (20 mg total) by mouth at bedtime.   Comp Met (CMET); Future  Prediabetes Chronic, stable A1c in prediabetic range. Advised dietary modifications to reduce carbohydrate intake, particularly rice and potatoes. Will check A1c intermittently in the future.  Orders:   HgB A1c; Future  History of thyroid  nodule S/P total thyroidectomy in 11/2023.  Continue taking Levothyroxine 100 mcg in an empty stomach and f/u with ENT as scheduled.      Stage 3a chronic kidney disease (HCC) Chronic. Encouraged adequate hydration. Will monitor renal function intermittently. Likely secondary to hypertension.     Chronic idiopathic constipation Continue f/u with ENT for post-thyroidectomy hypothyroidism. Increase fiber intake to 20 grams per day. Increase water intake to 50 ounces per day.    Other decreased white blood cell (WBC) count Chronic, mildly low WBC for 5 plus years. Will check CBC intermittently. No concerning symptoms. Consider hematology evaluation if change in symptoms or CBC.  Orders:    CBC w/Diff; Future  Postmenopausal estrogen deficiency Reviewed DEXA from [facility] 03/25/20 Oral calcium  caused constipation. Recommend calcium  supplementation through diet.  Recommended vitamin D  1000 IU daily. Repeat DEXA Ordered. Patient provided with phone number to call to schedule an appointment.   Encouraged regular exercise and healthy diet. Orders:   DG Bone Density; Future  Renal cyst, left Incidental, noted on RUQ usg on 01/29/21.  Patient asymptomatic.  Consider repeat ultrasound or urology referral if urinary symptoms, hematuria.     I personally spent a total of 40 minutes in the care of the patient today including preparing to see the patient, getting/reviewing separately obtained history, performing a medically appropriate exam/evaluation, counseling and educating, placing orders, documenting clinical information in the EHR, and coordinating care.  Return in about 6 months (around 04/09/2025) for chronic follow up, labs 2 days before appointment.   Luke Shade, MD "

## 2024-10-10 NOTE — Assessment & Plan Note (Addendum)
 Continue f/u with ENT for post-thyroidectomy hypothyroidism. Increase fiber intake to 20 grams per day. Increase water intake to 50 ounces per day.

## 2024-10-10 NOTE — Assessment & Plan Note (Signed)
 Incidental, noted on RUQ usg on 01/29/21.  Patient asymptomatic.  Consider repeat ultrasound or urology referral if urinary symptoms, hematuria.

## 2024-10-10 NOTE — Assessment & Plan Note (Addendum)
 Chronic, stable A1c in prediabetic range. Advised dietary modifications to reduce carbohydrate intake, particularly rice and potatoes. Will check A1c intermittently in the future.  Orders:   HgB A1c; Future

## 2024-10-12 ENCOUNTER — Ambulatory Visit: Admission: RE | Admit: 2024-10-12 | Discharge: 2024-10-12 | Disposition: A | Source: Ambulatory Visit

## 2024-10-12 DIAGNOSIS — Z780 Asymptomatic menopausal state: Secondary | ICD-10-CM | POA: Insufficient documentation

## 2024-10-16 ENCOUNTER — Ambulatory Visit: Payer: Self-pay

## 2024-10-16 DIAGNOSIS — M8589 Other specified disorders of bone density and structure, multiple sites: Secondary | ICD-10-CM

## 2024-10-16 NOTE — Progress Notes (Signed)
 Last mychart login 09/18/24.  Please call the patient to let her know her bone density shows osteopenia. Which means bone density is slightly lower than normal. Recommend taking vitamin D  2000 units daily and calcium  600 mg twice a day to help with bone health. Stay active with weight-bearing exercise like walking or light strength training. Repeat DEXA in 3 years.   Thank you, Luke Shade, MD

## 2024-10-17 NOTE — Telephone Encounter (Signed)
 Please let the patient know:   Kidney function has been stable. Telmisartan  has a kidney-protective effect and is commonly used to help preserve kidney function over time. We can discuss concerns about why she feels telmisartan  may be affecting her kidneys in more detail at her next visit.   Luke Shade, MD

## 2024-10-19 ENCOUNTER — Telehealth: Payer: Self-pay

## 2024-10-19 NOTE — Telephone Encounter (Signed)
 Copied from CRM #[id number]. Topic: Clinical - Lab/Test Results >> Oct 19, 2024  3:04 PM Stacy Vasquez wrote: Reason for CRM: Patient calling back to review her labs together, read the provider notes verbatim and patient had no additional questions at this time.

## 2024-11-28 ENCOUNTER — Ambulatory Visit

## 2025-04-09 ENCOUNTER — Other Ambulatory Visit

## 2025-04-09 ENCOUNTER — Ambulatory Visit

## 2025-04-11 ENCOUNTER — Ambulatory Visit
